# Patient Record
Sex: Female | Born: 1940 | Race: Black or African American | Hispanic: No | Marital: Single | Smoking: Never smoker
Health system: Southern US, Community
[De-identification: ages and names within clinical notes are randomized; demographics above are authoritative.]

## PROBLEM LIST (undated history)

## (undated) DIAGNOSIS — C541 Malignant neoplasm of endometrium: Secondary | ICD-10-CM

## (undated) DIAGNOSIS — K279 Peptic ulcer, site unspecified, unspecified as acute or chronic, without hemorrhage or perforation: Secondary | ICD-10-CM

## (undated) DIAGNOSIS — M1990 Unspecified osteoarthritis, unspecified site: Secondary | ICD-10-CM

## (undated) DIAGNOSIS — R5383 Other fatigue: Secondary | ICD-10-CM

## (undated) DIAGNOSIS — H269 Unspecified cataract: Secondary | ICD-10-CM

## (undated) DIAGNOSIS — K5792 Diverticulitis of intestine, part unspecified, without perforation or abscess without bleeding: Secondary | ICD-10-CM

## (undated) DIAGNOSIS — Z9289 Personal history of other medical treatment: Secondary | ICD-10-CM

## (undated) DIAGNOSIS — M810 Age-related osteoporosis without current pathological fracture: Secondary | ICD-10-CM

## (undated) DIAGNOSIS — K922 Gastrointestinal hemorrhage, unspecified: Secondary | ICD-10-CM

## (undated) DIAGNOSIS — I10 Essential (primary) hypertension: Secondary | ICD-10-CM

## (undated) HISTORY — DX: Unspecified osteoarthritis, unspecified site: M19.90

## (undated) HISTORY — DX: Age-related osteoporosis without current pathological fracture: M81.0

## (undated) HISTORY — DX: Other fatigue: R53.83

## (undated) HISTORY — DX: Essential (primary) hypertension: I10

## (undated) HISTORY — PX: TUBAL LIGATION: SHX77

---

## 1997-07-03 ENCOUNTER — Emergency Department (HOSPITAL_COMMUNITY): Admission: EM | Admit: 1997-07-03 | Discharge: 1997-07-03 | Payer: Self-pay | Admitting: Emergency Medicine

## 1998-04-17 ENCOUNTER — Emergency Department (HOSPITAL_COMMUNITY): Admission: EM | Admit: 1998-04-17 | Discharge: 1998-04-17 | Payer: Self-pay | Admitting: Emergency Medicine

## 1998-07-15 ENCOUNTER — Encounter: Payer: Self-pay | Admitting: Emergency Medicine

## 1998-07-15 ENCOUNTER — Emergency Department (HOSPITAL_COMMUNITY): Admission: EM | Admit: 1998-07-15 | Discharge: 1998-07-15 | Payer: Self-pay | Admitting: Emergency Medicine

## 1999-05-22 ENCOUNTER — Emergency Department (HOSPITAL_COMMUNITY): Admission: EM | Admit: 1999-05-22 | Discharge: 1999-05-22 | Payer: Self-pay | Admitting: Emergency Medicine

## 2006-12-06 ENCOUNTER — Emergency Department (HOSPITAL_COMMUNITY): Admission: EM | Admit: 2006-12-06 | Discharge: 2006-12-06 | Payer: Self-pay | Admitting: Emergency Medicine

## 2007-12-11 ENCOUNTER — Emergency Department (HOSPITAL_COMMUNITY): Admission: EM | Admit: 2007-12-11 | Discharge: 2007-12-11 | Payer: Self-pay | Admitting: Family Medicine

## 2008-02-11 ENCOUNTER — Emergency Department (HOSPITAL_COMMUNITY): Admission: EM | Admit: 2008-02-11 | Discharge: 2008-02-11 | Payer: Self-pay | Admitting: Emergency Medicine

## 2008-04-27 ENCOUNTER — Emergency Department (HOSPITAL_COMMUNITY): Admission: EM | Admit: 2008-04-27 | Discharge: 2008-04-27 | Payer: Self-pay | Admitting: Emergency Medicine

## 2009-02-23 ENCOUNTER — Encounter: Admission: RE | Admit: 2009-02-23 | Discharge: 2009-02-23 | Payer: Self-pay | Admitting: Family Medicine

## 2010-01-14 ENCOUNTER — Emergency Department (HOSPITAL_COMMUNITY): Admission: EM | Admit: 2010-01-14 | Discharge: 2009-03-14 | Payer: Self-pay | Admitting: Emergency Medicine

## 2010-02-06 ENCOUNTER — Emergency Department (HOSPITAL_COMMUNITY)
Admission: EM | Admit: 2010-02-06 | Discharge: 2010-02-06 | Payer: Self-pay | Source: Home / Self Care | Admitting: Family Medicine

## 2010-02-27 ENCOUNTER — Other Ambulatory Visit: Payer: Self-pay | Admitting: Family Medicine

## 2010-02-27 DIAGNOSIS — Z1239 Encounter for other screening for malignant neoplasm of breast: Secondary | ICD-10-CM

## 2010-02-27 DIAGNOSIS — Z780 Asymptomatic menopausal state: Secondary | ICD-10-CM

## 2010-02-28 ENCOUNTER — Encounter: Payer: Self-pay | Admitting: Family Medicine

## 2010-04-01 ENCOUNTER — Ambulatory Visit: Payer: Self-pay

## 2010-04-01 ENCOUNTER — Other Ambulatory Visit: Payer: Self-pay

## 2010-04-28 LAB — DIFFERENTIAL
Basophils Absolute: 0 10*3/uL (ref 0.0–0.1)
Basophils Relative: 0 % (ref 0–1)
Eosinophils Absolute: 0.2 10*3/uL (ref 0.0–0.7)
Neutro Abs: 10.1 10*3/uL — ABNORMAL HIGH (ref 1.7–7.7)
Neutrophils Relative %: 82 % — ABNORMAL HIGH (ref 43–77)

## 2010-04-28 LAB — URINALYSIS, ROUTINE W REFLEX MICROSCOPIC
Bilirubin Urine: NEGATIVE
Glucose, UA: NEGATIVE mg/dL
Hgb urine dipstick: NEGATIVE
Ketones, ur: NEGATIVE mg/dL
Nitrite: NEGATIVE
Protein, ur: NEGATIVE mg/dL
Specific Gravity, Urine: 1.013 (ref 1.005–1.030)
Urobilinogen, UA: 0.2 mg/dL (ref 0.0–1.0)
pH: 7.5 (ref 5.0–8.0)

## 2010-04-28 LAB — CBC
MCHC: 32.5 g/dL (ref 30.0–36.0)
MCV: 87.1 fL (ref 78.0–100.0)
Platelets: 198 10*3/uL (ref 150–400)
RDW: 14 % (ref 11.5–15.5)

## 2010-04-28 LAB — POCT I-STAT, CHEM 8
Chloride: 106 mEq/L (ref 96–112)
Glucose, Bld: 101 mg/dL — ABNORMAL HIGH (ref 70–99)
HCT: 52 % — ABNORMAL HIGH (ref 36.0–46.0)
Hemoglobin: 17.7 g/dL — ABNORMAL HIGH (ref 12.0–15.0)
Potassium: 4.9 mEq/L (ref 3.5–5.1)
Sodium: 137 mEq/L (ref 135–145)

## 2010-04-28 LAB — URINE MICROSCOPIC-ADD ON

## 2011-04-25 ENCOUNTER — Other Ambulatory Visit: Payer: Self-pay | Admitting: Family Medicine

## 2011-04-25 DIAGNOSIS — Z1231 Encounter for screening mammogram for malignant neoplasm of breast: Secondary | ICD-10-CM

## 2011-04-25 DIAGNOSIS — Z780 Asymptomatic menopausal state: Secondary | ICD-10-CM

## 2011-05-09 ENCOUNTER — Ambulatory Visit
Admission: RE | Admit: 2011-05-09 | Discharge: 2011-05-09 | Disposition: A | Discharge status: Home / Self Care | Payer: Medicare Other | Source: Ambulatory Visit | Attending: Family Medicine | Admitting: Family Medicine

## 2011-05-09 ENCOUNTER — Ambulatory Visit: Payer: Self-pay

## 2011-05-09 DIAGNOSIS — Z780 Asymptomatic menopausal state: Secondary | ICD-10-CM

## 2011-06-07 ENCOUNTER — Encounter: Payer: Self-pay | Admitting: Internal Medicine

## 2011-10-15 ENCOUNTER — Emergency Department (HOSPITAL_COMMUNITY)
Admission: EM | Admit: 2011-10-15 | Discharge: 2011-10-15 | Discharge status: Against Medical Advice | Payer: Medicare Other | Attending: Emergency Medicine | Admitting: Emergency Medicine

## 2011-10-15 ENCOUNTER — Encounter (HOSPITAL_COMMUNITY): Payer: Self-pay | Admitting: Physical Medicine and Rehabilitation

## 2011-10-15 DIAGNOSIS — R112 Nausea with vomiting, unspecified: Secondary | ICD-10-CM | POA: Insufficient documentation

## 2011-10-15 DIAGNOSIS — R1032 Left lower quadrant pain: Secondary | ICD-10-CM | POA: Insufficient documentation

## 2011-10-15 HISTORY — DX: Diverticulitis of intestine, part unspecified, without perforation or abscess without bleeding: K57.92

## 2011-10-15 LAB — COMPREHENSIVE METABOLIC PANEL
ALT: 13 U/L (ref 0–35)
AST: 23 U/L (ref 0–37)
Albumin: 3.9 g/dL (ref 3.5–5.2)
Chloride: 101 mEq/L (ref 96–112)
Creatinine, Ser: 0.57 mg/dL (ref 0.50–1.10)
Potassium: 4.1 mEq/L (ref 3.5–5.1)
Sodium: 136 mEq/L (ref 135–145)
Total Bilirubin: 0.2 mg/dL — ABNORMAL LOW (ref 0.3–1.2)

## 2011-10-15 LAB — CBC WITH DIFFERENTIAL/PLATELET
Eosinophils Absolute: 0.1 10*3/uL (ref 0.0–0.7)
Eosinophils Relative: 1 % (ref 0–5)
Lymphs Abs: 1 10*3/uL (ref 0.7–4.0)
MCH: 27.8 pg (ref 26.0–34.0)
MCHC: 32.5 g/dL (ref 30.0–36.0)
MCV: 85.5 fL (ref 78.0–100.0)
Monocytes Relative: 3 % (ref 3–12)
Platelets: 243 10*3/uL (ref 150–400)
RBC: 5.44 MIL/uL — ABNORMAL HIGH (ref 3.87–5.11)

## 2011-10-15 NOTE — ED Notes (Signed)
Pt presents to department for evaluation of LLQ abdominal pain and N/V/D. Onset today. Pt states history of diverticulitis. 5/10 "cramping" pain at the time. Denies urinary symptoms. She is alert and oriented x4.

## 2012-12-01 ENCOUNTER — Inpatient Hospital Stay (HOSPITAL_COMMUNITY)
Admission: EM | Admit: 2012-12-01 | Discharge: 2012-12-02 | DRG: 446 | Disposition: A | Discharge status: Home / Self Care | Payer: Medicare Other | Attending: Internal Medicine | Admitting: Internal Medicine

## 2012-12-01 ENCOUNTER — Inpatient Hospital Stay (HOSPITAL_COMMUNITY): Payer: Medicare Other

## 2012-12-01 ENCOUNTER — Other Ambulatory Visit (HOSPITAL_COMMUNITY): Payer: Medicare Other

## 2012-12-01 ENCOUNTER — Encounter (HOSPITAL_COMMUNITY): Payer: Self-pay | Admitting: Emergency Medicine

## 2012-12-01 ENCOUNTER — Emergency Department (HOSPITAL_COMMUNITY): Payer: Medicare Other

## 2012-12-01 DIAGNOSIS — R109 Unspecified abdominal pain: Secondary | ICD-10-CM

## 2012-12-01 DIAGNOSIS — Z9851 Tubal ligation status: Secondary | ICD-10-CM

## 2012-12-01 DIAGNOSIS — R112 Nausea with vomiting, unspecified: Secondary | ICD-10-CM

## 2012-12-01 DIAGNOSIS — I4510 Unspecified right bundle-branch block: Secondary | ICD-10-CM | POA: Diagnosis present

## 2012-12-01 DIAGNOSIS — K8000 Calculus of gallbladder with acute cholecystitis without obstruction: Principal | ICD-10-CM | POA: Diagnosis present

## 2012-12-01 DIAGNOSIS — I10 Essential (primary) hypertension: Secondary | ICD-10-CM | POA: Diagnosis present

## 2012-12-01 DIAGNOSIS — D72829 Elevated white blood cell count, unspecified: Secondary | ICD-10-CM

## 2012-12-01 DIAGNOSIS — Z7982 Long term (current) use of aspirin: Secondary | ICD-10-CM

## 2012-12-01 DIAGNOSIS — E559 Vitamin D deficiency, unspecified: Secondary | ICD-10-CM | POA: Diagnosis present

## 2012-12-01 DIAGNOSIS — K810 Acute cholecystitis: Secondary | ICD-10-CM

## 2012-12-01 DIAGNOSIS — Z8249 Family history of ischemic heart disease and other diseases of the circulatory system: Secondary | ICD-10-CM

## 2012-12-01 DIAGNOSIS — Z79899 Other long term (current) drug therapy: Secondary | ICD-10-CM

## 2012-12-01 HISTORY — DX: Nausea with vomiting, unspecified: R11.2

## 2012-12-01 LAB — CBC
MCH: 28.2 pg (ref 26.0–34.0)
MCV: 85.3 fL (ref 78.0–100.0)
Platelets: 266 10*3/uL (ref 150–400)
RDW: 13.6 % (ref 11.5–15.5)
WBC: 10.2 10*3/uL (ref 4.0–10.5)

## 2012-12-01 LAB — COMPREHENSIVE METABOLIC PANEL
AST: 24 U/L (ref 0–37)
Albumin: 3.9 g/dL (ref 3.5–5.2)
Calcium: 9.9 mg/dL (ref 8.4–10.5)
Creatinine, Ser: 0.59 mg/dL (ref 0.50–1.10)
Total Protein: 8.2 g/dL (ref 6.0–8.3)

## 2012-12-01 LAB — URINALYSIS, ROUTINE W REFLEX MICROSCOPIC
Glucose, UA: NEGATIVE mg/dL
Leukocytes, UA: NEGATIVE
Protein, ur: NEGATIVE mg/dL
pH: 8 (ref 5.0–8.0)

## 2012-12-01 MED ORDER — ASPIRIN 81 MG PO TABS: 81.0000 mg | ORAL_TABLET | Freq: Every day | ORAL | Status: DC

## 2012-12-01 MED ORDER — LOSARTAN POTASSIUM 50 MG PO TABS: 100.0000 mg | ORAL_TABLET | Freq: Every day | ORAL | Status: DC

## 2012-12-01 MED ORDER — IOHEXOL 300 MG/ML  SOLN
25.0000 mL | INTRAMUSCULAR | Status: AC
  Administered 2012-12-01 (×2): 25 mL via ORAL

## 2012-12-01 MED ORDER — LOSARTAN POTASSIUM 50 MG PO TABS
100.0000 mg | ORAL_TABLET | Freq: Every day | ORAL | Status: DC
  Administered 2012-12-01 – 2012-12-02 (×2): 100 mg via ORAL
  Filled 2012-12-01 (×2): qty 2

## 2012-12-01 MED ORDER — ONDANSETRON HCL 4 MG/2ML IJ SOLN
4.0000 mg | Freq: Four times a day (QID) | INTRAMUSCULAR | Status: DC | PRN
  Administered 2012-12-01: 4 mg via INTRAVENOUS
  Filled 2012-12-01: qty 2

## 2012-12-01 MED ORDER — ONDANSETRON HCL 4 MG/2ML IJ SOLN: 4.0000 mg | Freq: Three times a day (TID) | INTRAMUSCULAR | Status: DC | PRN

## 2012-12-01 MED ORDER — ASPIRIN 81 MG PO CHEW
81.0000 mg | CHEWABLE_TABLET | Freq: Every day | ORAL | Status: DC
  Administered 2012-12-01 – 2012-12-02 (×2): 81 mg via ORAL
  Filled 2012-12-01 (×2): qty 1

## 2012-12-01 MED ORDER — HYDROCODONE-ACETAMINOPHEN 5-325 MG PO TABS: 1.0000 | ORAL_TABLET | ORAL | Status: DC | PRN

## 2012-12-01 MED ORDER — ONDANSETRON HCL 4 MG PO TABS: 4.0000 mg | ORAL_TABLET | Freq: Four times a day (QID) | ORAL | Status: DC | PRN

## 2012-12-01 MED ORDER — ENOXAPARIN SODIUM 40 MG/0.4ML ~~LOC~~ SOLN
40.0000 mg | SUBCUTANEOUS | Status: DC
  Administered 2012-12-01 – 2012-12-02 (×2): 40 mg via SUBCUTANEOUS
  Filled 2012-12-01 (×2): qty 0.4

## 2012-12-01 MED ORDER — SODIUM CHLORIDE 0.9 % IV SOLN
INTRAVENOUS | Status: DC
  Administered 2012-12-01 (×2): via INTRAVENOUS

## 2012-12-01 MED ORDER — SODIUM CHLORIDE 0.9 % IV SOLN: INTRAVENOUS | Status: DC

## 2012-12-01 MED ORDER — IOHEXOL 300 MG/ML  SOLN
100.0000 mL | Freq: Once | INTRAMUSCULAR | Status: AC | PRN
  Administered 2012-12-01: 100 mL via INTRAVENOUS

## 2012-12-01 MED ORDER — ONDANSETRON HCL 4 MG/2ML IJ SOLN
4.0000 mg | Freq: Once | INTRAMUSCULAR | Status: AC
  Administered 2012-12-01: 4 mg via INTRAVENOUS
  Filled 2012-12-01: qty 2

## 2012-12-01 MED ORDER — HYDROMORPHONE HCL PF 1 MG/ML IJ SOLN: 1.0000 mg | INTRAMUSCULAR | Status: DC | PRN

## 2012-12-01 MED ORDER — AMLODIPINE BESYLATE 5 MG PO TABS
5.0000 mg | ORAL_TABLET | Freq: Every day | ORAL | Status: DC
  Administered 2012-12-01 – 2012-12-02 (×2): 5 mg via ORAL
  Filled 2012-12-01 (×2): qty 1

## 2012-12-01 NOTE — ED Notes (Signed)
Pt vomiting returned.  Now NPO.  Physician aware, order rec'd i.e. zofran IV.

## 2012-12-01 NOTE — ED Notes (Signed)
Patient transported to X-ray 

## 2012-12-01 NOTE — ED Notes (Signed)
Pt given crackers and ginger ale per v.o. Dr Dierdre Highman.

## 2012-12-01 NOTE — ED Provider Notes (Signed)
CSN: [id number]     Arrival date & time 12/01/12  0405 History   First MD Initiated Contact with Patient 12/01/12 0435     Chief Complaint  Patient presents with  . Emesis   (Consider location/radiation/quality/duration/timing/severity/associated sxs/prior Treatment) HPI History provided by patient. Around 11 PM developed nausea and vomiting followed by loose stools. No blood in emesis or stool. With onset of symptoms did have some left lower quadrant abdominal pain. Patient called EMS and received IV fluids and Zofran and is no longer having any abdominal pain. Patient had similar symptoms with a bout of diverticulitis in the past. She has remote history of tubal ligation but no other abdominal surgeries. No fevers or chills. No back pain. No difficulty urinating. Symptoms moderate in severity. Past Medical History  Diagnosis Date  . HTN (hypertension)   . Fatigue   . Vitamin D deficiency   . Arthritis   . Diverticulitis    Past Surgical History  Procedure Laterality Date  . Tubal ligation      at age 67   Family History  Problem Relation Age of Onset  . Heart attack Father 38    Died of natural causes  . Other Mother 76    Died of old age   History  Substance Use Topics  . Smoking status: Never Smoker   . Smokeless tobacco: Not on file  . Alcohol Use: No   OB History   Grav Para Term Preterm Abortions TAB SAB Ect Mult Living                 Review of Systems  Constitutional: Negative for fever and chills.  Eyes: Negative for visual disturbance.  Respiratory: Negative for shortness of breath.   Cardiovascular: Negative for chest pain.  Gastrointestinal: Positive for nausea, vomiting and diarrhea. Negative for blood in stool.  Genitourinary: Negative for dysuria.  Musculoskeletal: Negative for back pain, neck pain and neck stiffness.  Skin: Negative for rash.  Neurological: Negative for headaches.  All other systems reviewed and are negative.    Allergies   Review of patient's allergies indicates no known allergies.  Home Medications   Current Outpatient Rx  Name  Route  Sig  Dispense  Refill  . amLODipine (NORVASC) 5 MG tablet   Oral   Take 5 mg by mouth daily.         Marland Kitchen aspirin 81 MG tablet   Oral   Take 81 mg by mouth daily.         Alphonsus Sias Pollen 580 MG CAPS   Oral   Take 1 capsule by mouth daily.          . Cholecalciferol (VITAMIN D3) 5000 UNITS TABS   Oral   Take 1 tablet by mouth daily.         Marland Kitchen losartan (COZAAR) 100 MG tablet   Oral   Take 100 mg by mouth daily.          BP 158/97  Pulse 65  Temp(Src) 97.4 F (36.3 C) (Oral)  Resp 18  Ht 5\' 4"  (1.626 m)  Wt 174 lb (78.926 kg)  BMI 29.85 kg/m2  SpO2 100% Physical Exam  Constitutional: She is oriented to person, place, and time. She appears well-developed and well-nourished.  HENT:  Head: Normocephalic and atraumatic.  Mouth/Throat: Oropharynx is clear and moist.  Eyes: EOM are normal. Pupils are equal, round, and reactive to light. No scleral icterus.  Neck: Neck supple.  Cardiovascular: Normal  rate, regular rhythm and intact distal pulses.   Pulmonary/Chest: Effort normal and breath sounds normal. No respiratory distress.  Abdominal: Soft. Bowel sounds are normal. She exhibits no distension. There is no tenderness. There is no rebound and no guarding.  Musculoskeletal: Normal range of motion. She exhibits no edema.  Neurological: She is alert and oriented to person, place, and time.  Skin: Skin is warm and dry.    ED Course  Procedures (including critical care time) Labs Review Labs Reviewed  CBC - Abnormal; Notable for the following:    RBC 5.31 (*)    All other components within normal limits  COMPREHENSIVE METABOLIC PANEL - Abnormal; Notable for the following:    Glucose, Bld 161 (*)    Total Bilirubin 0.2 (*)    All other components within normal limits  LIPASE, BLOOD  URINALYSIS, ROUTINE W REFLEX MICROSCOPIC   Imaging Review No  results found.   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 IV Zofran. Serial abdominal exams nontender and pain-free in the emergency dept. Patient getting up to walk to the bathroom having loose stools.  She declines need for any pain medications.   5:53 AM third round of Zofran provided and patient still with nausea vomiting. Repeat exam remains benign with abdomen soft nontender nondistended. Acute abdominal series ordered and medicine consult for admission. D/w Dr Lovell Sheehan, plan admit MDM  Diagnosis: Persistent vomiting, nausea vomiting diarrhea  Evaluated with EKG, labs Treated with IV fluids and IV Zofran - remains symptomatic MED admit  Sunnie Nielsen, MD 12/01/12 0630

## 2012-12-01 NOTE — ED Notes (Signed)
Pt reports she at baked beans at approx 11pm tonight and began vomiting approx 2am.  Also soft stool but not runny.

## 2012-12-01 NOTE — ED Notes (Signed)
Patient denies pain although complains of soreness in abdomen. NAD noted report called to [address] [place] to [facility] [employer] [facility] [employer]. Patient to be transferred via stretcher NAD noted.

## 2012-12-01 NOTE — H&P (Signed)
[facility] History and Physical  April Lane ZOX:[id number] DOB: Dec 12, 1940 DOA: 12/01/2012  Referring physician: ED physician PCP: Willow Ora, MD   Chief Complaint: nausea and vomiting   HPI:  Pt is 72 yo pleasant female who presented to [facility] ED with main concern of several days duration of persistent generalized abdominal pain, throbbing and intermittent, 5/10 in severity, non radiating and associated with cramps, nausea and vomiting, loose  Stools and poor oral intake. She denies sick contacts or exposures, no recent sicknesses or hospitalizations, no similar events in the past. She denies fevers, chills, chest pain or shortness of breath. No specific urinary concerns.  In ED, pt with persistent discomfort. CBC adn BMP within normal limits. [facility] asked to admit for possible viral gastroenteritis.  Assessment and Plan:  Active Problems:   Nausea with vomiting - with loose stools - unclear etiology - will admit to medical floor and will provide supportive care with IVF, analgesia and antiemetics as needed - follow upon Ct abd/pelvis - keep NPO for now - continue to monitor electrolytes and supplement as indicated - check stool culture, O&P, GI viral panel    Code Status: Full Family Communication: Pt at bedside Disposition Plan: Admit to medical floor    Review of Systems:  Constitutional: Negative for diaphoresis.  HENT: Negative for hearing loss, ear pain, nosebleeds, congestion, sore throat, neck pain, tinnitus and ear discharge.   Eyes: Negative for blurred vision, double vision, photophobia, pain, discharge and redness.  Respiratory: Negative for cough, hemoptysis, sputum production, shortness of breath, wheezing and stridor.   Cardiovascular: Negative for chest pain, palpitations, orthopnea, claudication and leg swelling.  Gastrointestinal: Negative for heartburn, constipation, blood in stool and melena.  Genitourinary: Negative for dysuria, urgency,  frequency, hematuria and flank pain.  Musculoskeletal: Negative for myalgias, back pain, joint pain and falls.  Skin: Negative for itching and rash.  Neurological: Negative for tingling, tremors, sensory change, speech change, focal weakness, loss of consciousness and headaches.  Endo/Heme/Allergies: Negative for environmental allergies and polydipsia. Does not bruise/bleed easily.  Psychiatric/Behavioral: Negative for suicidal ideas. The patient is not nervous/anxious.      Past Medical History  Diagnosis Date  . HTN (hypertension)   . Fatigue   . Vitamin D deficiency   . Arthritis   . Diverticulitis     Past Surgical History  Procedure Laterality Date  . Tubal ligation      at age 63    Social History:  reports that she has never smoked. She does not have any smokeless tobacco history on file. She reports that she does not drink alcohol or use illicit drugs.  No Known Allergies  Family History  Problem Relation Age of Onset  . Heart attack Father 79    Died of natural causes  . Other Mother 8    Died of old age    Prior to Admission medications   Medication Sig Start Date End Date Taking? Authorizing Provider  amLODipine (NORVASC) 5 MG tablet Take 5 mg by mouth daily.   Yes Historical Provider, MD  aspirin 81 MG tablet Take 81 mg by mouth daily.   Yes Historical Provider, MD  Bee Pollen 580 MG CAPS Take 1 capsule by mouth daily.    Yes Historical Provider, MD  Cholecalciferol (VITAMIN D3) 5000 UNITS TABS Take 1 tablet by mouth daily.   Yes Historical Provider, MD  losartan (COZAAR) 100 MG tablet Take 100 mg by mouth daily.   Yes Historical Provider,  MD    Physical Exam: Filed Vitals:   12/01/12 0730 12/01/12 0735 12/01/12 0811 12/01/12 0852  BP:  146/77 155/72   Pulse: 94 66 66   Temp:   97.4 F (36.3 C)   TempSrc:   Oral   Resp:  18 20   Height:   5\' 4"  (1.626 m) 5\' 3"  (1.6 m)  Weight:   74 kg (163 lb 2.3 oz) 78.926 kg (174 lb)  SpO2: 67% 100% 100%      Physical Exam  Constitutional: Appears well-developed and well-nourished. No distress.  HENT: Normocephalic. External right and left ear normal. Oropharynx is clear and moist.  Eyes: Conjunctivae and EOM are normal. PERRLA, no scleral icterus.  Neck: Normal ROM. Neck supple. No JVD. No tracheal deviation. No thyromegaly.  CVS: RRR, S1/S2 +, no murmurs, no gallops, no carotid bruit.  Pulmonary: Effort and breath sounds normal, no stridor, rhonchi, wheezes, rales.  Abdominal: Soft. BS +,  no distension, tenderness, rebound or guarding.  Musculoskeletal: Normal range of motion. No edema and no tenderness.  Lymphadenopathy: No lymphadenopathy noted, cervical, inguinal. Neuro: Alert. Normal reflexes, muscle tone coordination. No cranial nerve deficit. Skin: Skin is warm and dry. No rash noted. Not diaphoretic. No erythema. No pallor.  Psychiatric: Normal mood and affect. Behavior, judgment, thought content normal.   Labs on Admission:  Basic Metabolic Panel:  Recent Labs Lab 12/01/12 0438  NA 135  K 3.9  CL 100  CO2 25  GLUCOSE 161*  BUN 18  CREATININE 0.59  CALCIUM 9.9   Liver Function Tests:  Recent Labs Lab 12/01/12 0438  AST 24  ALT 13  ALKPHOS 107  BILITOT 0.2*  PROT 8.2  ALBUMIN 3.9    Recent Labs Lab 12/01/12 0438  LIPASE 18   CBC:  Recent Labs Lab 12/01/12 0438  WBC 10.2  HGB 15.0  HCT 45.3  MCV 85.3  PLT 266   Radiological Exams on Admission: Dg Abd Acute W/chest 12/01/2012  1. Unremarkable bowel gas pattern; no free intra-abdominal air seen. 2. No acute cardiopulmonary process identified.     EKG: Normal sinus rhythm, no ST/T wave changes  Debbora Presto, MD  [facility] Pager [phone]  If 7PM-7AM, please contact night-coverage [email or web address] Password [facility] 12/01/2012, 9:17 AM

## 2012-12-02 DIAGNOSIS — D72829 Elevated white blood cell count, unspecified: Secondary | ICD-10-CM

## 2012-12-02 DIAGNOSIS — K8020 Calculus of gallbladder without cholecystitis without obstruction: Secondary | ICD-10-CM

## 2012-12-02 DIAGNOSIS — K810 Acute cholecystitis: Secondary | ICD-10-CM

## 2012-12-02 DIAGNOSIS — R109 Unspecified abdominal pain: Secondary | ICD-10-CM

## 2012-12-02 DIAGNOSIS — R112 Nausea with vomiting, unspecified: Secondary | ICD-10-CM

## 2012-12-02 LAB — CBC
HCT: 43.9 % (ref 36.0–46.0)
MCH: 28.1 pg (ref 26.0–34.0)
MCV: 84.4 fL (ref 78.0–100.0)
Platelets: 270 10*3/uL (ref 150–400)
RBC: 5.2 MIL/uL — ABNORMAL HIGH (ref 3.87–5.11)
RDW: 13.6 % (ref 11.5–15.5)
WBC: 14.6 10*3/uL — ABNORMAL HIGH (ref 4.0–10.5)

## 2012-12-02 LAB — BASIC METABOLIC PANEL
BUN: 12 mg/dL (ref 6–23)
CO2: 23 mEq/L (ref 19–32)
Chloride: 100 mEq/L (ref 96–112)
GFR calc Af Amer: >90 mL/min (ref >90)
GFR calc non Af Amer: >90 mL/min (ref >90)
Potassium: 3.5 mEq/L (ref 3.5–5.1)
Sodium: 135 mEq/L (ref 135–145)

## 2012-12-02 MED ORDER — CIPROFLOXACIN IN D5W 400 MG/200ML IV SOLN
400.0000 mg | Freq: Two times a day (BID) | INTRAVENOUS | Status: DC
  Administered 2012-12-02: 400 mg via INTRAVENOUS
  Filled 2012-12-02 (×2): qty 200

## 2012-12-02 MED ORDER — CIPROFLOXACIN HCL 500 MG PO TABS: 500.0000 mg | ORAL_TABLET | Freq: Two times a day (BID) | ORAL | Status: DC

## 2012-12-02 MED ORDER — ONDANSETRON 4 MG PO TBDP: 4.0000 mg | ORAL_TABLET | Freq: Three times a day (TID) | ORAL | Status: DC | PRN

## 2012-12-02 NOTE — Discharge Summary (Signed)
Physician Discharge Summary  April Lane WGN:[id number] DOB: 07/05/1940 DOA: 12/01/2012  PCP: Willow Ora, MD  Admit date: 12/01/2012 Discharge date: 12/02/2012  Time spent: 35 minutes  Recommendations for Outpatient Follow-up:  1. Patient did not wish to stay for cholecystectomy. I instructed her to followup with Dr. Carolynne Edouard on general surgery early this week  Discharge Diagnoses:  Active Problems:   Nausea with vomiting   Cholecystitis, acute   Leukocytosis, unspecified   Abdominal  pain, other specified site   Discharge Condition: Stable  Diet recommendation: Heart healthy diet  Filed Weights   12/01/12 0415 12/01/12 0811 12/01/12 0852  Weight: 78.926 kg (174 lb) 74 kg (163 lb 2.3 oz) 78.926 kg (174 lb)    History of present illness:  Pt is 72 yo pleasant female who presented to [facility] ED with main concern of several days duration of persistent generalized abdominal pain, throbbing and intermittent, 5/10 in severity, non radiating and associated with cramps, nausea and vomiting, loose Stools and poor oral intake. She denies sick contacts or exposures, no recent sicknesses or hospitalizations, no similar events in the past. She denies fevers, chills, chest pain or shortness of breath. No specific urinary concerns.  Hospital Course:  Patient was admitted to the medicine service, given IV fluid resuscitation, supportive care. By the following morning she reported having significant improvement to her symptoms expressing desire to be discharged today. However CT scan of abdomen and pelvis showed pericholecystic fluid and diffuse gallbladder wall thickening with presence of large stones within the gallbladder, findings suggestive of acute cholecystitis. I consulted Dr. Carolynne Edouard of Gen. Surgery. He recommended starting ciprofloxacin. He also recommended patient to stay in the hospital to continue antibiotic therapy and undergo cholecystectomy prior to discharge. Patient felt very strongly  about leaving the hospital today and follow up with surgery as an outpatient. I reassessed in the evening, and again verbalized her desire to go home today. I again communicated surgery's recommendations for her to stay, explaining that she could potentially get worse at home. I also explained her abnormal lab work including elevated white count. Patient verbalized her understanding of the potential consequences however expresses her desire to be discharged today and followup with surgery as an outpatient. I noted that she continued to be asymptomatic, was tolerating by mouth intake, remained afebrile and hemodynamically stable. Per patient's wishes, she was discharged on 12/02/2012   Consultations:  General surgery  Discharge Exam: Filed Vitals:   12/02/12 1350  BP: 127/78  Pulse: 81  Temp: 98.3 F (36.8 C)  Resp: 17    General: Patient is in no acute distress she is awake alert and oriented Cardiovascular: Regular rate and rhythm normal S1-S2 Respiratory: Lungs clear to auscultation bilaterally no wheezing rhonchi or rales Abdomen: Soft nontender nondistended positive bowel sounds Extremities: No edema  Discharge Instructions      Discharge Orders   Future Orders Complete By Expires   Call MD for:  extreme fatigue  As directed    Call MD for:  persistant nausea and vomiting  As directed    Call MD for:  severe uncontrolled pain  As directed    Call MD for:  temperature >100.4  As directed    Diet - low sodium heart healthy  As directed    Discharge instructions  As directed    Comments:     Please follow up with Dr Carolynne Edouard of General Surgery [address] [place], [place], [place] [phone]  Call tomorrow morning to schedule  an appointment this week   Increase activity slowly  As directed        Medication List         amLODipine 5 MG tablet  Commonly known as:  NORVASC  Take 5 mg by mouth daily.     aspirin 81 MG tablet  Take 81 mg by mouth daily.     Bee Pollen 580  MG Caps  Take 1 capsule by mouth daily.     ciprofloxacin 500 MG tablet  Commonly known as:  CIPRO  Take 1 tablet (500 mg total) by mouth 2 (two) times daily.     losartan 100 MG tablet  Commonly known as:  COZAAR  Take 100 mg by mouth daily.     ondansetron 4 MG disintegrating tablet  Commonly known as:  ZOFRAN ODT  Take 1 tablet (4 mg total) by mouth every 8 (eight) hours as needed for nausea.     Vitamin D3 5000 UNITS Tabs  Take 1 tablet by mouth daily.       No Known Allergies Follow-up Information   Follow up with TOTH III,PAUL S, MD In 3 days.   Specialty:  General Surgery   Contact information:   [address] [place] [address] [phone]       Follow up with ANDY,CAMILLE L, MD In 1 week.   Specialty:  Family Medicine   Contact information:   [address] [place] [address] [phone]        The results of significant diagnostics from this hospitalization (including imaging, microbiology, ancillary and laboratory) are listed below for reference.    Significant Diagnostic Studies: Ct Abdomen Pelvis W Contrast  12/01/2012   CLINICAL DATA:  Lower abdominal pain, nausea, vomiting and diarrhea.  EXAM: CT ABDOMEN AND PELVIS WITH CONTRAST  TECHNIQUE: Multidetector CT imaging of the abdomen and pelvis was performed using the standard protocol following bolus administration of intravenous contrast.  CONTRAST:  OMNIPAQUE IOHEXOL 300 MG/ML  SOLN  COMPARISON:  CT of the abdomen and pelvis performed 03/14/2009  FINDINGS: The visualized lung bases are clear.  Pericholecystic fluid and diffuse gallbladder wall thickening are noted, with minimal associated soft tissue stranding. Large stones are again seen within the gallbladder. Findings are most compatible with acute cholecystitis.  A few small calcified granulomata are seen within the liver. The liver and spleen are otherwise unremarkable in appearance. The pancreas and adrenal glands  are unremarkable.  Scattered hypodensities within the kidneys likely reflect small cysts; the kidneys are otherwise unremarkable in appearance. There is no evidence of hydronephrosis. No renal or ureteral stones are seen. No perinephric stranding is appreciated.  No free fluid is identified. The small bowel is unremarkable in appearance. The stomach is within normal limits. No acute vascular abnormalities are seen.  The appendix is normal in caliber and contains air, without evidence for appendicitis. Contrast progresses to the distal ascending colon. Scattered diverticulosis is noted along the distal descending and sigmoid colon, without evidence of diverticulitis. The colon is otherwise unremarkable in appearance.  The bladder is mildly distended and grossly unremarkable in appearance. Multiple fibroids are seen within the uterus, several of which are calcified in nature. The ovaries are relatively symmetric, with a small left adnexal cyst, likely physiologic in nature. No suspicious adnexal masses are seen. No inguinal lymphadenopathy is seen.  No acute osseous abnormalities are identified. Multilevel vacuum phenomenon is incidentally noted at the lower lumbar spine.  IMPRESSION: 1. Pericholecystic  fluid and diffuse gallbladder wall thickening, with minimal associated soft tissue inflammation. Large stones again seen in the gallbladder. Findings compatible with acute cholecystitis. 2. Likely small hepatic cysts seen. 3. Scattered diverticulosis along the distal descending and sigmoid colon, without evidence of diverticulitis. 4. Fibroid uterus noted.  These results were called by telephone at the time of interpretation on 12/01/2012 at 11:17 PM to Clydie Braun RN on [facility], who verbally acknowledged these results.   Electronically Signed   By: Roanna Raider M.D.   On: 12/01/2012 23:18   Dg Abd Acute W/chest  12/01/2012   CLINICAL DATA:  Nausea and vomiting; history of diverticulitis.  EXAM: ACUTE ABDOMEN SERIES  (ABDOMEN 2 VIEW & CHEST 1 VIEW)  COMPARISON:  CT of the abdomen and pelvis performed 03/14/2009, and chest radiograph performed 02/06/2010  FINDINGS: The lungs are well-aerated and clear. There is no evidence of focal opacification, pleural effusion or pneumothorax. The cardiomediastinal silhouette is within normal limits.  The visualized bowel gas pattern is unremarkable. Scattered stool and air are seen within the colon; there is no evidence of small bowel dilatation to suggest obstruction. No free intra-abdominal air is identified on the provided upright view.  No acute osseous abnormalities are seen; the sacroiliac joints are unremarkable in appearance.  IMPRESSION: 1. Unremarkable bowel gas pattern; no free intra-abdominal air seen. 2. No acute cardiopulmonary process identified.   Electronically Signed   By: Roanna Raider M.D.   On: 12/01/2012 06:55    Microbiology: No results found for this or any previous visit (from the past 240 hour(s)).   Labs: Basic Metabolic Panel:  Recent Labs Lab 12/01/12 0438 12/02/12 0530  NA 135 135  K 3.9 3.5  CL 100 100  CO2 25 23  GLUCOSE 161* 119*  BUN 18 12  CREATININE 0.59 0.57  CALCIUM 9.9 9.7   Liver Function Tests:  Recent Labs Lab 12/01/12 0438  AST 24  ALT 13  ALKPHOS 107  BILITOT 0.2*  PROT 8.2  ALBUMIN 3.9    Recent Labs Lab 12/01/12 0438  LIPASE 18   No results found for this basename: AMMONIA,  in the last 168 hours CBC:  Recent Labs Lab 12/01/12 0438 12/02/12 0530  WBC 10.2 14.6*  HGB 15.0 14.6  HCT 45.3 43.9  MCV 85.3 84.4  PLT 266 270   Cardiac Enzymes: No results found for this basename: CKTOTAL, CKMB, CKMBINDEX, TROPONINI,  in the last 168 hours BNP: BNP (last 3 results) No results found for this basename: PROBNP,  in the last 8760 hours CBG: No results found for this basename: GLUCAP,  in the last 168 hours     Signed:  Jeralyn Bennett  [facility] 12/02/2012, 6:05 PM

## 2012-12-02 NOTE — Consult Note (Signed)
Reason for Consult: cholelithiasis/cholecystitis Referring Physician: Crista Elliot  April Lane is an 72 y.o. female.  HPI: Pt is 72 yo pleasant female who presented to [facility] ED with main concern of nausea and vomiting that started Friday PM after eating some beans.  April Lane also complained of  loose Stools and poor oral intake. April Lane tells me April Lane did not have pain till April Lane started having ongoing episode of emesis.  That does not correspond to what April Lane said in the ER where April Lane reported abdominal pain. April Lane denies sick contacts or exposures, no recent sicknesses or hospitalizations, no similar events in the past. April Lane denies fevers, chills, chest pain or shortness of breath. No specific urinary concerns. Work up in the ER revealed a normal CBC, lft's were normal, and lipase was normal.  CT scan showed;  Pericholecystic fluid and diffuse gallbladder wall thickening, with minimal associated soft tissue inflammation. Large stones again seen in the gallbladder. Findings compatible with acute Cholecystitis. Repeat labs this AM show a normal BMP, WBC is up, no repeat lipase or LFT's.  To day April Lane feels fine on clear liquids and want to go home and address this another day.     Past Medical History  Diagnosis Date  . HTN (hypertension)   . Fatigue   . Vitamin D deficiency   . Arthritis   . Diverticulitis  03/2009     Past Surgical History  Procedure Laterality Date  . Tubal ligation      at age 52    Family History  Problem Relation Age of Onset  . Heart attack Father 78    Died of natural causes  . Other Mother 43    Died of old age    Social History:  reports that April Lane has never smoked. April Lane does not have any smokeless tobacco history on file. April Lane reports that April Lane does not drink alcohol or use illicit drugs.  Allergies: No Known Allergies  Medications:  Prior to Admission:  Prescriptions prior to admission  Medication Sig Dispense Refill  . amLODipine (NORVASC) 5 MG tablet Take 5 mg by  mouth daily.      Marland Kitchen aspirin 81 MG tablet Take 81 mg by mouth daily.      Alphonsus Sias Pollen 580 MG CAPS Take 1 capsule by mouth daily.       . Cholecalciferol (VITAMIN D3) 5000 UNITS TABS Take 1 tablet by mouth daily.      Marland Kitchen losartan (COZAAR) 100 MG tablet Take 100 mg by mouth daily.       Scheduled: . amLODipine  5 mg Oral Daily  . aspirin  81 mg Oral Daily  . ciprofloxacin  400 mg Intravenous Q12H  . enoxaparin (LOVENOX) injection  40 mg Subcutaneous Q24H  . losartan  100 mg Oral Daily   Continuous: . sodium chloride 125 mL/hr at 12/01/12 1051   ZOX:[id number], HYDROmorphone (DILAUDID) injection, ondansetron (ZOFRAN) IV, ondansetron Anti-infectives   Start     Dose/Rate Route Frequency Ordered Stop   12/02/12 1100  ciprofloxacin (CIPRO) IVPB 400 mg     400 mg 200 mL/hr over 60 Minutes Intravenous Every 12 hours 12/02/12 1011        Results for orders placed during the hospital encounter of 12/01/12 (from the past 48 hour(s))  CBC     Status: Abnormal   Collection Time    12/01/12  4:38 AM      Result Value Range   WBC 10.2  4.0 - 10.5 K/uL  RBC 5.31 (*) 3.87 - 5.11 MIL/uL   Hemoglobin 15.0  12.0 - 15.0 g/dL   HCT 11.9  14.7 - 82.9 %   MCV 85.3  78.0 - 100.0 fL   MCH 28.2  26.0 - 34.0 pg   MCHC 33.1  30.0 - 36.0 g/dL   RDW 56.2  13.0 - 86.5 %   Platelets 266  150 - 400 K/uL  COMPREHENSIVE METABOLIC PANEL     Status: Abnormal   Collection Time    12/01/12  4:38 AM      Result Value Range   Sodium 135  135 - 145 mEq/L   Potassium 3.9  3.5 - 5.1 mEq/L   Chloride 100  96 - 112 mEq/L   CO2 25  19 - 32 mEq/L   Glucose, Bld 161 (*) 70 - 99 mg/dL   BUN 18  6 - 23 mg/dL   Creatinine, Ser 7.84  0.50 - 1.10 mg/dL   Calcium 9.9  8.4 - 69.6 mg/dL   Total Protein 8.2  6.0 - 8.3 g/dL   Albumin 3.9  3.5 - 5.2 g/dL   AST 24  0 - 37 U/L   ALT 13  0 - 35 U/L   Alkaline Phosphatase 107  39 - 117 U/L   Total Bilirubin 0.2 (*) 0.3 - 1.2 mg/dL   GFR calc non Af Amer >90   >90 mL/min   GFR calc Af Amer >90  >90 mL/min   Comment: (NOTE)     The eGFR has been calculated using the CKD EPI equation.     This calculation has not been validated in all clinical situations.     eGFR's persistently <90 mL/min signify possible Chronic Kidney     Disease.  LIPASE, BLOOD     Status: None   Collection Time    12/01/12  4:38 AM      Result Value Range   Lipase 18  11 - 59 U/L  URINALYSIS, ROUTINE W REFLEX MICROSCOPIC     Status: None   Collection Time    12/01/12  4:53 AM      Result Value Range   Color, Urine YELLOW  YELLOW   APPearance CLEAR  CLEAR   Specific Gravity, Urine 1.014  1.005 - 1.030   pH 8.0  5.0 - 8.0   Glucose, UA NEGATIVE  NEGATIVE mg/dL   Hgb urine dipstick NEGATIVE  NEGATIVE   Bilirubin Urine NEGATIVE  NEGATIVE   Ketones, ur NEGATIVE  NEGATIVE mg/dL   Protein, ur NEGATIVE  NEGATIVE mg/dL   Urobilinogen, UA 0.2  0.0 - 1.0 mg/dL   Nitrite NEGATIVE  NEGATIVE   Leukocytes, UA NEGATIVE  NEGATIVE   Comment: MICROSCOPIC NOT DONE ON URINES WITH NEGATIVE PROTEIN, BLOOD, LEUKOCYTES, NITRITE, OR GLUCOSE <1000 mg/dL.  BASIC METABOLIC PANEL     Status: Abnormal   Collection Time    12/02/12  5:30 AM      Result Value Range   Sodium 135  135 - 145 mEq/L   Potassium 3.5  3.5 - 5.1 mEq/L   Chloride 100  96 - 112 mEq/L   CO2 23  19 - 32 mEq/L   Glucose, Bld 119 (*) 70 - 99 mg/dL   BUN 12  6 - 23 mg/dL   Creatinine, Ser 2.95  0.50 - 1.10 mg/dL   Calcium 9.7  8.4 - 28.4 mg/dL   GFR calc non Af Amer >90  >90 mL/min   GFR calc Af Amer >  90  >90 mL/min   Comment: (NOTE)     The eGFR has been calculated using the CKD EPI equation.     This calculation has not been validated in all clinical situations.     eGFR's persistently <90 mL/min signify possible Chronic Kidney     Disease.  CBC     Status: Abnormal   Collection Time    12/02/12  5:30 AM      Result Value Range   WBC 14.6 (*) 4.0 - 10.5 K/uL   RBC 5.20 (*) 3.87 - 5.11 MIL/uL   Hemoglobin 14.6   12.0 - 15.0 g/dL   HCT 65.7  84.6 - 96.2 %   MCV 84.4  78.0 - 100.0 fL   MCH 28.1  26.0 - 34.0 pg   MCHC 33.3  30.0 - 36.0 g/dL   RDW 95.2  84.1 - 32.4 %   Platelets 270  150 - 400 K/uL    Ct Abdomen Pelvis W Contrast  12/01/2012   CLINICAL DATA:  Lower abdominal pain, nausea, vomiting and diarrhea.  EXAM: CT ABDOMEN AND PELVIS WITH CONTRAST  TECHNIQUE: Multidetector CT imaging of the abdomen and pelvis was performed using the standard protocol following bolus administration of intravenous contrast.  CONTRAST:  OMNIPAQUE IOHEXOL 300 MG/ML  SOLN  COMPARISON:  CT of the abdomen and pelvis performed 03/14/2009  FINDINGS: The visualized lung bases are clear.  Pericholecystic fluid and diffuse gallbladder wall thickening are noted, with minimal associated soft tissue stranding. Large stones are again seen within the gallbladder. Findings are most compatible with acute cholecystitis.  A few small calcified granulomata are seen within the liver. The liver and spleen are otherwise unremarkable in appearance. The pancreas and adrenal glands are unremarkable.  Scattered hypodensities within the kidneys likely reflect small cysts; the kidneys are otherwise unremarkable in appearance. There is no evidence of hydronephrosis. No renal or ureteral stones are seen. No perinephric stranding is appreciated.  No free fluid is identified. The small bowel is unremarkable in appearance. The stomach is within normal limits. No acute vascular abnormalities are seen.  The appendix is normal in caliber and contains air, without evidence for appendicitis. Contrast progresses to the distal ascending colon. Scattered diverticulosis is noted along the distal descending and sigmoid colon, without evidence of diverticulitis. The colon is otherwise unremarkable in appearance.  The bladder is mildly distended and grossly unremarkable in appearance. Multiple fibroids are seen within the uterus, several of which are calcified in  nature. The ovaries are relatively symmetric, with a small left adnexal cyst, likely physiologic in nature. No suspicious adnexal masses are seen. No inguinal lymphadenopathy is seen.  No acute osseous abnormalities are identified. Multilevel vacuum phenomenon is incidentally noted at the lower lumbar spine.  IMPRESSION: 1. Pericholecystic fluid and diffuse gallbladder wall thickening, with minimal associated soft tissue inflammation. Large stones again seen in the gallbladder. Findings compatible with acute cholecystitis. 2. Likely small hepatic cysts seen. 3. Scattered diverticulosis along the distal descending and sigmoid colon, without evidence of diverticulitis. 4. Fibroid uterus noted.  These results were called by telephone at the time of interpretation on 12/01/2012 at 11:17 PM to Clydie Braun RN on [facility], who verbally acknowledged these results.   Electronically Signed   By: Roanna Raider M.D.   On: 12/01/2012 23:18   Dg Abd Acute W/chest  12/01/2012   CLINICAL DATA:  Nausea and vomiting; history of diverticulitis.  EXAM: ACUTE ABDOMEN SERIES (ABDOMEN 2 VIEW & CHEST 1 VIEW)  COMPARISON:  CT of the abdomen and pelvis performed 03/14/2009, and chest radiograph performed 02/06/2010  FINDINGS: The lungs are well-aerated and clear. There is no evidence of focal opacification, pleural effusion or pneumothorax. The cardiomediastinal silhouette is within normal limits.  The visualized bowel gas pattern is unremarkable. Scattered stool and air are seen within the colon; there is no evidence of small bowel dilatation to suggest obstruction. No free intra-abdominal air is identified on the provided upright view.  No acute osseous abnormalities are seen; the sacroiliac joints are unremarkable in appearance.  IMPRESSION: 1. Unremarkable bowel gas pattern; no free intra-abdominal air seen. 2. No acute cardiopulmonary process identified.   Electronically Signed   By: Roanna Raider M.D.   On: 12/01/2012 06:55    Review  of Systems  Constitutional: Negative for fever, chills, weight loss, malaise/fatigue and diaphoresis.  HENT: Negative.   Eyes: Negative.        Early cataracts developing  Respiratory: Positive for cough. Negative for hemoptysis, sputum production, shortness of breath and wheezing.   Cardiovascular: Negative.   Gastrointestinal: Positive for nausea, vomiting, abdominal pain (discomfort in LUQ after vomiting, no pain with onset of symptoms.) and diarrhea (diarrhea and vomiting both started Friday evening after eating some beans.). Negative for constipation, blood in stool and melena.       Occasional constipation   Genitourinary: Negative.   Musculoskeletal: Negative.   Skin: Negative.   Neurological: Negative.  Negative for weakness.  Endo/Heme/Allergies: Negative.   Psychiatric/Behavioral: Negative.    Blood pressure 127/61, pulse 79, temperature 97.9 F (36.6 C), temperature source Oral, resp. rate 18, height 5\' 3"  (1.6 m), weight 78.926 kg (174 lb), SpO2 99.00%. Physical Exam  Constitutional: April Lane is oriented to person, place, and time. April Lane appears well-developed and well-nourished. No distress.  Taking clears, no pain, no nausea, no vomiting now.  HENT:  Head: Normocephalic and atraumatic.  Nose: Nose normal.  Eyes: Conjunctivae and EOM are normal. Pupils are equal, round, and reactive to light. Right eye exhibits no discharge. Left eye exhibits no discharge. No scleral icterus.  Neck: Normal range of motion. Neck supple. No JVD present. No tracheal deviation present. No thyromegaly present.  Cardiovascular: Normal rate, regular rhythm, normal heart sounds and intact distal pulses.  Exam reveals no gallop.   No murmur heard. Respiratory: Effort normal and breath sounds normal. No respiratory distress. April Lane has no wheezes. April Lane has no rales. April Lane exhibits no tenderness.  GI: Soft. Bowel sounds are normal. April Lane exhibits no distension and no mass. There is tenderness (April Lane can still feel  some soreness LUQ, not having any pain now.). There is no rebound and no guarding.  Musculoskeletal: April Lane exhibits no edema and no tenderness.  Lymphadenopathy:    April Lane has no cervical adenopathy.  Neurological: April Lane is alert and oriented to person, place, and time. No cranial nerve deficit.  Skin: Skin is warm. No rash noted. April Lane is diaphoretic. No erythema. No pallor.  Psychiatric: April Lane has a normal mood and affect. Her behavior is normal. Judgment and thought content normal.    Assessment/Plan: 1.  Acute onset of nausea/vomiting/diarrhea with possible enteritis. 2. Cholelithiasis with cholecystitis on CT, currently asymptomatic. 3.  Leukocytosis 4.  Hypertension 5.  Hx of mild diverticulitis with cholelithiasis found on CT 03/14/09.  Plan:  Based on rising WBC and CT findings I would leave her on antibiotics today and get her Gallbladder out tomorrow.  April Lane would like to go home and come back another time, especially  now that April Lane feels better.  April Lane shows no sign of cholecystitis currently on exam.  Will discuss with Dr. Carolynne Edouard.    Keshun Berrett 12/02/2012, 1:35 PM

## 2012-12-02 NOTE — Consult Note (Signed)
Although the pt appears to have cholecystitis, she feels fine now without any symptoms. My recommendation would be for her to stay and be treated with abx and surgery to remove her gallbladder. She on the other hand feels very strongly that she wants to leave and follow up with [place] as an outpatient. If she chooses this route then she needs to be on abx at home and have close follow up with surgery. She understands that she could go home and get sick and have to come back. We will be happy to take care of her whatever she chooses

## 2012-12-19 ENCOUNTER — Encounter (INDEPENDENT_AMBULATORY_CARE_PROVIDER_SITE_OTHER): Payer: Self-pay | Admitting: General Surgery

## 2012-12-19 ENCOUNTER — Ambulatory Visit (INDEPENDENT_AMBULATORY_CARE_PROVIDER_SITE_OTHER): Payer: Medicare Other | Admitting: General Surgery

## 2012-12-19 VITALS — BP 128/80 | HR 78 | Temp 97.2°F | Resp 18 | Ht 64.0 in | Wt 169.0 lb

## 2012-12-19 DIAGNOSIS — K810 Acute cholecystitis: Secondary | ICD-10-CM

## 2012-12-19 NOTE — Progress Notes (Signed)
Patient ID: April Lane, female   DOB: 09/24/1940, 72 y.o.   MRN: [id number]  Chief Complaint  Patient presents with  . Cholelithiasis    HPI April Lane is a 72 y.o. female.  We are asked to see the patient in consultation by Dr. Andy to evaluate her for gallstones. The patient is a 72-year-old female who was recently hospitalized for a day with cholecystitis with cholelithiasis. The recommendation was for her to stay and have her gallbladder removed but she declined and left the hospital AGAINST MEDICAL ADVICE. She has done well at home. She denies any abdominal pain. Her appetite has been good. She has had some diarrhea. Her CT scan showed large stones in her gallbladder with surrounding inflammation and a thickened gallbladder wall. Her liver functions were normal. HPI  Past Medical History  Diagnosis Date  . HTN (hypertension)   . Fatigue   . Vitamin D deficiency   . Arthritis   . Diverticulitis   . Osteoporosis     Past Surgical History  Procedure Laterality Date  . Tubal ligation      at age 32    Family History  Problem Relation Age of Onset  . Heart attack Father 50    Died of natural causes  . Other Mother 97    Died of old age  . Prostate cancer Father     Social History History  Substance Use Topics  . Smoking status: Never Smoker   . Smokeless tobacco: Not on file  . Alcohol Use: No    No Known Allergies  Current Outpatient Prescriptions  Medication Sig Dispense Refill  . amLODipine (NORVASC) 5 MG tablet Take 5 mg by mouth daily.      . aspirin 81 MG tablet Take 81 mg by mouth daily.      . Bee Pollen 580 MG CAPS Take 1 capsule by mouth daily.       . Cholecalciferol (VITAMIN D3) 5000 UNITS TABS Take 1 tablet by mouth daily.      . losartan (COZAAR) 100 MG tablet Take 100 mg by mouth daily.      . naproxen sodium (ANAPROX) 220 MG tablet Take 220 mg by mouth as needed.       No current facility-administered medications for this visit.    Review  of Systems Review of Systems  Constitutional: Negative.   HENT: Negative.   Eyes: Negative.   Respiratory: Negative.   Cardiovascular: Negative.   Gastrointestinal: Positive for diarrhea.  Endocrine: Negative.   Genitourinary: Negative.   Musculoskeletal: Negative.   Skin: Negative.   Allergic/Immunologic: Negative.   Neurological: Negative.   Hematological: Negative.   Psychiatric/Behavioral: Negative.     Blood pressure 128/80, pulse 78, temperature 97.2 F (36.2 C), temperature source Temporal, resp. rate 18, height 5' 4" (1.626 m), weight 169 lb (76.658 kg).  Physical Exam Physical Exam  Constitutional: She is oriented to person, place, and time. She appears well-developed and well-nourished.  HENT:  Head: Normocephalic and atraumatic.  Eyes: Conjunctivae and EOM are normal. Pupils are equal, round, and reactive to light.  Neck: Normal range of motion. Neck supple.  Cardiovascular: Normal rate, regular rhythm and normal heart sounds.   Pulmonary/Chest: Effort normal and breath sounds normal.  Abdominal: Soft. Bowel sounds are normal. There is no tenderness.  Musculoskeletal: Normal range of motion.  Neurological: She is alert and oriented to person, place, and time.  Skin: Skin is warm and dry.  Psychiatric: She has   a normal mood and affect. Her behavior is normal.    Data Reviewed As above  Assessment    The patient had a significant episode of cholecystitis with cholelithiasis. Her symptoms have resolved on antibiotics. Because of the risk of further painful episodes and possible pancreatitis I think she would benefit from having her gallbladder removed. She would also like to have this done. I've discussed with her in detail the risks and benefits of the operation to remove the gallbladder as well as some of the technical aspects and she understands and wishes to proceed     Plan    Plan for laparoscopic cholecystectomy with intraoperative cholangiogram         TOTH III,Yvone Slape S 12/19/2012, 3:35 PM    

## 2012-12-19 NOTE — Patient Instructions (Signed)
Plan for lap chole with ioc 

## 2012-12-31 ENCOUNTER — Encounter (HOSPITAL_COMMUNITY): Payer: Self-pay | Admitting: Pharmacy Technician

## 2013-01-09 ENCOUNTER — Encounter (HOSPITAL_COMMUNITY): Payer: Self-pay

## 2013-01-09 ENCOUNTER — Encounter (HOSPITAL_COMMUNITY)
Admission: RE | Admit: 2013-01-09 | Discharge: 2013-01-09 | Disposition: A | Discharge status: Home / Self Care | Payer: Medicare Other | Source: Ambulatory Visit | Attending: General Surgery | Admitting: General Surgery

## 2013-01-09 ENCOUNTER — Ambulatory Visit (HOSPITAL_COMMUNITY)
Admission: RE | Admit: 2013-01-09 | Discharge: 2013-01-09 | Disposition: A | Discharge status: Home / Self Care | Payer: Medicare Other | Source: Ambulatory Visit | Attending: Anesthesiology | Admitting: Anesthesiology

## 2013-01-09 DIAGNOSIS — I10 Essential (primary) hypertension: Secondary | ICD-10-CM | POA: Insufficient documentation

## 2013-01-09 DIAGNOSIS — Z01818 Encounter for other preprocedural examination: Secondary | ICD-10-CM | POA: Insufficient documentation

## 2013-01-09 HISTORY — DX: Personal history of other medical treatment: Z92.89

## 2013-01-09 HISTORY — DX: Peptic ulcer, site unspecified, unspecified as acute or chronic, without hemorrhage or perforation: K27.9

## 2013-01-09 HISTORY — DX: Gastrointestinal hemorrhage, unspecified: K92.2

## 2013-01-09 HISTORY — DX: Unspecified cataract: H26.9

## 2013-01-09 LAB — BASIC METABOLIC PANEL
Calcium: 10.6 mg/dL — ABNORMAL HIGH (ref 8.4–10.5)
Chloride: 103 mEq/L (ref 96–112)
GFR calc Af Amer: >90 mL/min (ref >90)
Glucose, Bld: 103 mg/dL — ABNORMAL HIGH (ref 70–99)
Potassium: 3.9 mEq/L (ref 3.5–5.1)
Sodium: 138 mEq/L (ref 135–145)

## 2013-01-09 LAB — CBC
HCT: 42.7 % (ref 36.0–46.0)
Hemoglobin: 14 g/dL (ref 12.0–15.0)
RBC: 4.97 MIL/uL (ref 3.87–5.11)
RDW: 13.9 % (ref 11.5–15.5)
WBC: 7.9 10*3/uL (ref 4.0–10.5)

## 2013-01-09 MED ORDER — CHLORHEXIDINE GLUCONATE 4 % EX LIQD: 1.0000 "application " | Freq: Once | CUTANEOUS | Status: DC

## 2013-01-09 NOTE — Pre-Procedure Instructions (Signed)
April Lane  01/09/2013   Your procedure is scheduled on:  Mon, Dec 8 @ 10:00 AM  Report to [facility] Short Stay Entrance A at 8:00 AM.  Call this number if you have problems the morning of surgery: [phone]   Remember:   Do not eat food or drink liquids after midnight.   Take these medicines the morning of surgery with A SIP OF WATER: Amlodipine(Norvasc)                          Stop taking your Aspirin,Diclofenac gel,and Naproxen. No Goody's,BC's,Ibuprofen,Fish Oil,or any Herbal Medications   Do not wear jewelry, make-up or nail polish.  Do not wear lotions, powders, or perfumes. You may wear deodorant.  Do not shave 48 hours prior to surgery.   Do not bring valuables to the hospital.  [facility] is not responsible                  for any belongings or valuables.               Contacts, dentures or bridgework may not be worn into surgery.  Leave suitcase in the car. After surgery it may be brought to your room.  For patients admitted to the hospital, discharge time is determined by your                treatment team.               Patients discharged the day of surgery will not be allowed to drive  home.    Special Instructions: Shower using CHG 2 nights before surgery and the night before surgery.  If you shower the day of surgery use CHG.  Use special wash - you have one bottle of CHG for all showers.  You should use approximately 1/3 of the bottle for each shower.   Please read over the following fact sheets that you were given: Pain Booklet, Coughing and Deep Breathing and Surgical Site Infection Prevention

## 2013-01-09 NOTE — Progress Notes (Addendum)
Saw a cardiologist in 2010-d/t being stressed out from a job;said could not find anything wrong-only saw him this one time and didn't require follow up but doesn't have a clue who she saw  Denies ever having an echo/stress test/ heart cath   Denies CXR in past yr  EKG in epic from 12-01-12  Medical Md is Dr.Camille Mardelle Matte

## 2013-01-10 NOTE — Progress Notes (Signed)
Anesthesia chart review:  Patient is a 72 year old female scheduled for laparoscopic cholecystectomy on 01/14/13 by Dr. Carolynne Edouard.  History includes non-smoker, diverticulitis, arthritis, HTN, GI bleed, peptic ulcer, history of transfusion, cataract, fatigue.  She was admitted to [facility] on 12/01/12 with N/V, abdominal pain, and leukocytosis and was diagnosed with acute cholelithiasis.  Cholecystectomy was recommended at that time, but she declined and left AMA with plans for antibiotics and close follow-up.  PCP is listed as Dr. Asencion Partridge.    EKG on 12/01/12 showed SR, consider right atrial enlargement, right BBB, LVH, consider old lateral infarct.  Computerized interpretation also thought there was ST elevation in inferior leads.  There was some baseline wandering which may have contributed.  The ED physician did not feel there was any acute ST changes.  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 She reported seeing a cardiologist (unknown) in 2010 due to stress but denied having prior cardiac studies such as echo, stress, or cath.  Preoperative CXR and labs noted.  History and prior EKGs from 12/01/12 and 02/06/10 reviewed with anesthesiologist Dr. Chaney Malling. If no new or acute CV symptoms then it is anticipated that she can proceed as planned.  Further evaluation by her assigned anesthesiologist on the day of surgery.  Velna Ochs [facility] Short Stay Center/Anesthesiology Phone [phone] 01/10/2013 4:44 PM

## 2013-01-13 MED ORDER — CEFAZOLIN SODIUM-DEXTROSE 2-3 GM-% IV SOLR
2.0000 g | INTRAVENOUS | Status: AC
  Administered 2013-01-14: 2 g via INTRAVENOUS
  Filled 2013-01-13: qty 50

## 2013-01-14 ENCOUNTER — Encounter (HOSPITAL_COMMUNITY): Payer: Medicare Other | Admitting: Vascular Surgery

## 2013-01-14 ENCOUNTER — Encounter (HOSPITAL_COMMUNITY): Payer: Self-pay | Admitting: *Deleted

## 2013-01-14 ENCOUNTER — Encounter (HOSPITAL_COMMUNITY)
Admission: RE | Disposition: A | Discharge status: Home / Self Care | Payer: Self-pay | Source: Ambulatory Visit | Attending: General Surgery

## 2013-01-14 ENCOUNTER — Observation Stay (HOSPITAL_COMMUNITY)
Admission: RE | Admit: 2013-01-14 | Discharge: 2013-01-15 | Disposition: A | Discharge status: Home / Self Care | Payer: Medicare Other | Source: Ambulatory Visit | Attending: General Surgery | Admitting: General Surgery

## 2013-01-14 ENCOUNTER — Inpatient Hospital Stay (HOSPITAL_COMMUNITY): Payer: Medicare Other

## 2013-01-14 ENCOUNTER — Inpatient Hospital Stay (HOSPITAL_COMMUNITY): Payer: Medicare Other | Admitting: Anesthesiology

## 2013-01-14 DIAGNOSIS — E559 Vitamin D deficiency, unspecified: Secondary | ICD-10-CM | POA: Insufficient documentation

## 2013-01-14 DIAGNOSIS — M810 Age-related osteoporosis without current pathological fracture: Secondary | ICD-10-CM | POA: Insufficient documentation

## 2013-01-14 DIAGNOSIS — M129 Arthropathy, unspecified: Secondary | ICD-10-CM | POA: Insufficient documentation

## 2013-01-14 DIAGNOSIS — R5381 Other malaise: Secondary | ICD-10-CM | POA: Insufficient documentation

## 2013-01-14 DIAGNOSIS — K276 Chronic or unspecified peptic ulcer, site unspecified, with both hemorrhage and perforation: Secondary | ICD-10-CM | POA: Insufficient documentation

## 2013-01-14 DIAGNOSIS — K8010 Calculus of gallbladder with chronic cholecystitis without obstruction: Secondary | ICD-10-CM | POA: Diagnosis present

## 2013-01-14 DIAGNOSIS — I10 Essential (primary) hypertension: Secondary | ICD-10-CM | POA: Insufficient documentation

## 2013-01-14 HISTORY — PX: CHOLECYSTECTOMY: SHX55

## 2013-01-14 SURGERY — LAPAROSCOPIC CHOLECYSTECTOMY WITH INTRAOPERATIVE CHOLANGIOGRAM
Anesthesia: General | Site: Abdomen

## 2013-01-14 MED ORDER — SODIUM CHLORIDE 0.9 % IV SOLN
INTRAVENOUS | Status: DC | PRN
  Administered 2013-01-14: 10:00:00

## 2013-01-14 MED ORDER — LOSARTAN POTASSIUM 50 MG PO TABS
100.0000 mg | ORAL_TABLET | Freq: Every day | ORAL | Status: DC
  Administered 2013-01-15: 100 mg via ORAL
  Filled 2013-01-14 (×3): qty 2

## 2013-01-14 MED ORDER — LABETALOL HCL 5 MG/ML IV SOLN
INTRAVENOUS | Status: DC | PRN
  Administered 2013-01-14: 10 mg via INTRAVENOUS

## 2013-01-14 MED ORDER — MIDAZOLAM HCL 5 MG/5ML IJ SOLN
INTRAMUSCULAR | Status: DC | PRN
  Administered 2013-01-14: 1 mg via INTRAVENOUS

## 2013-01-14 MED ORDER — SODIUM CHLORIDE 0.9 % IR SOLN
Status: DC | PRN
  Administered 2013-01-14: 1000 mL

## 2013-01-14 MED ORDER — FENTANYL CITRATE 0.05 MG/ML IJ SOLN
INTRAMUSCULAR | Status: DC | PRN
  Administered 2013-01-14 (×4): 50 ug via INTRAVENOUS

## 2013-01-14 MED ORDER — ONDANSETRON HCL 4 MG PO TABS: 4.0000 mg | ORAL_TABLET | Freq: Four times a day (QID) | ORAL | Status: DC | PRN

## 2013-01-14 MED ORDER — OXYCODONE-ACETAMINOPHEN 5-325 MG PO TABS
1.0000 | ORAL_TABLET | ORAL | Status: DC | PRN
  Administered 2013-01-14: 1 via ORAL
  Administered 2013-01-14 – 2013-01-15 (×2): 2 via ORAL
  Filled 2013-01-14: qty 2
  Filled 2013-01-14: qty 1
  Filled 2013-01-14: qty 2

## 2013-01-14 MED ORDER — LACTATED RINGERS IV SOLN
INTRAVENOUS | Status: DC | PRN
  Administered 2013-01-14 (×2): via INTRAVENOUS

## 2013-01-14 MED ORDER — NEOSTIGMINE METHYLSULFATE 1 MG/ML IJ SOLN
INTRAMUSCULAR | Status: DC | PRN
  Administered 2013-01-14: 3 mg via INTRAVENOUS

## 2013-01-14 MED ORDER — AMLODIPINE BESYLATE 5 MG PO TABS
5.0000 mg | ORAL_TABLET | Freq: Every day | ORAL | Status: DC
  Administered 2013-01-15: 5 mg via ORAL
  Filled 2013-01-14 (×3): qty 1

## 2013-01-14 MED ORDER — PROPOFOL 10 MG/ML IV BOLUS
INTRAVENOUS | Status: DC | PRN
  Administered 2013-01-14: 130 mg via INTRAVENOUS

## 2013-01-14 MED ORDER — ONDANSETRON HCL 4 MG/2ML IJ SOLN
INTRAMUSCULAR | Status: DC | PRN
  Administered 2013-01-14: 4 mg via INTRAVENOUS

## 2013-01-14 MED ORDER — LIDOCAINE HCL (CARDIAC) 20 MG/ML IV SOLN
INTRAVENOUS | Status: DC | PRN
  Administered 2013-01-14: 80 mg via INTRAVENOUS

## 2013-01-14 MED ORDER — LACTATED RINGERS IV SOLN
Freq: Once | INTRAVENOUS | Status: AC
  Administered 2013-01-14: 09:00:00 via INTRAVENOUS

## 2013-01-14 MED ORDER — KCL IN DEXTROSE-NACL 20-5-0.9 MEQ/L-%-% IV SOLN
INTRAVENOUS | Status: DC
  Administered 2013-01-14: 13:00:00 via INTRAVENOUS
  Filled 2013-01-14 (×4): qty 1000

## 2013-01-14 MED ORDER — BUPIVACAINE-EPINEPHRINE 0.25% -1:200000 IJ SOLN
INTRAMUSCULAR | Status: DC | PRN
  Administered 2013-01-14: 30 mL

## 2013-01-14 MED ORDER — ROCURONIUM BROMIDE 100 MG/10ML IV SOLN
INTRAVENOUS | Status: DC | PRN
  Administered 2013-01-14: 40 mg via INTRAVENOUS

## 2013-01-14 MED ORDER — MORPHINE SULFATE 4 MG/ML IJ SOLN: 4.0000 mg | INTRAMUSCULAR | Status: DC | PRN

## 2013-01-14 MED ORDER — ONDANSETRON HCL 4 MG/2ML IJ SOLN
4.0000 mg | Freq: Four times a day (QID) | INTRAMUSCULAR | Status: DC | PRN
  Administered 2013-01-15 (×2): 4 mg via INTRAVENOUS
  Filled 2013-01-14 (×2): qty 2

## 2013-01-14 MED ORDER — PHENYLEPHRINE HCL 10 MG/ML IJ SOLN
INTRAMUSCULAR | Status: DC | PRN
  Administered 2013-01-14 (×3): 80 ug via INTRAVENOUS

## 2013-01-14 MED ORDER — GLYCOPYRROLATE 0.2 MG/ML IJ SOLN
INTRAMUSCULAR | Status: DC | PRN
  Administered 2013-01-14: 0.4 mg via INTRAVENOUS

## 2013-01-14 SURGICAL SUPPLY — 39 items
ADH SKN CLS APL DERMABOND .7 (GAUZE/BANDAGES/DRESSINGS) ×1
APPLIER CLIP ROT 10 11.4 M/L (STAPLE) ×2
APR CLP MED LRG 11.4X10 (STAPLE) ×1
BAG SPEC RTRVL LRG 6X4 10 (ENDOMECHANICALS) ×1
BLADE SURG ROTATE 9660 (MISCELLANEOUS) IMPLANT
CANISTER SUCTION 2500CC (MISCELLANEOUS) ×2 IMPLANT
CATH REDDICK CHOLANGI 4FR 50CM (CATHETERS) ×2 IMPLANT
CHLORAPREP W/TINT 26ML (MISCELLANEOUS) ×2 IMPLANT
CLIP APPLIE ROT 10 11.4 M/L (STAPLE) ×1 IMPLANT
COVER MAYO STAND STRL (DRAPES) ×2 IMPLANT
COVER SURGICAL LIGHT HANDLE (MISCELLANEOUS) ×2 IMPLANT
DECANTER SPIKE VIAL GLASS SM (MISCELLANEOUS) ×3 IMPLANT
DERMABOND ADVANCED (GAUZE/BANDAGES/DRESSINGS) ×1
DERMABOND ADVANCED .7 DNX12 (GAUZE/BANDAGES/DRESSINGS) ×1 IMPLANT
DRAPE C-ARM 42X72 X-RAY (DRAPES) ×2 IMPLANT
DRAPE UTILITY 15X26 W/TAPE STR (DRAPE) ×4 IMPLANT
ELECT REM PT RETURN 9FT ADLT (ELECTROSURGICAL) ×2
ELECTRODE REM PT RTRN 9FT ADLT (ELECTROSURGICAL) ×1 IMPLANT
GLOVE BIO SURGEON STRL SZ7.5 (GLOVE) ×2 IMPLANT
GLOVE BIOGEL PI IND STRL 7.0 (GLOVE) IMPLANT
GLOVE BIOGEL PI INDICATOR 7.0 (GLOVE) ×2
GOWN STRL NON-REIN LRG LVL3 (GOWN DISPOSABLE) ×7 IMPLANT
IV CATH 14GX2 1/4 (CATHETERS) ×2 IMPLANT
KIT BASIN OR (CUSTOM PROCEDURE TRAY) ×2 IMPLANT
KIT ROOM TURNOVER OR (KITS) ×2 IMPLANT
NS IRRIG 1000ML POUR BTL (IV SOLUTION) ×2 IMPLANT
PAD ARMBOARD 7.5X6 YLW CONV (MISCELLANEOUS) ×2 IMPLANT
POUCH SPECIMEN RETRIEVAL 10MM (ENDOMECHANICALS) ×2 IMPLANT
SCISSORS LAP 5X35 DISP (ENDOMECHANICALS) ×2 IMPLANT
SET IRRIG TUBING LAPAROSCOPIC (IRRIGATION / IRRIGATOR) ×2 IMPLANT
SLEEVE ENDOPATH XCEL 5M (ENDOMECHANICALS) ×2 IMPLANT
SPECIMEN JAR SMALL (MISCELLANEOUS) ×2 IMPLANT
SUT MNCRL AB 4-0 PS2 18 (SUTURE) ×2 IMPLANT
TOWEL OR 17X24 6PK STRL BLUE (TOWEL DISPOSABLE) ×2 IMPLANT
TOWEL OR 17X26 10 PK STRL BLUE (TOWEL DISPOSABLE) ×2 IMPLANT
TRAY LAPAROSCOPIC (CUSTOM PROCEDURE TRAY) ×2 IMPLANT
TROCAR XCEL BLUNT TIP 100MML (ENDOMECHANICALS) ×2 IMPLANT
TROCAR XCEL NON-BLD 11X100MML (ENDOMECHANICALS) ×2 IMPLANT
TROCAR XCEL NON-BLD 5MMX100MML (ENDOMECHANICALS) ×2 IMPLANT

## 2013-01-14 NOTE — Transfer of Care (Signed)
Immediate Anesthesia Transfer of Care Note  Patient: April Lane  Procedure(s) Performed: Procedure(s): LAPAROSCOPIC CHOLECYSTECTOMY WITH INTRAOPERATIVE CHOLANGIOGRAM (N/A)  Patient Location: PACU  Anesthesia Type:General  Level of Consciousness: sedated, patient cooperative and responds to stimulation  Airway & Oxygen Therapy: Patient Spontanous Breathing and Patient connected to nasal cannula oxygen  Post-op Assessment: Report given to PACU RN, Post -op Vital signs reviewed and stable and Patient moving all extremities  Post vital signs: Reviewed and stable  Complications: No apparent anesthesia complications

## 2013-01-14 NOTE — Op Note (Signed)
01/14/2013  10:24 AM  PATIENT:  April Lane  72 y.o. female  PRE-OPERATIVE DIAGNOSIS:  cholecystitis with cholelithiasis  POST-OPERATIVE DIAGNOSIS:  cholecystitis with cholelithiasis  PROCEDURE:  Procedure(s): LAPAROSCOPIC CHOLECYSTECTOMY WITH INTRAOPERATIVE CHOLANGIOGRAM (N/A)  SURGEON:  Surgeon(s) and Role:    * Robyne Askew, MD - Primary  PHYSICIAN ASSISTANT:   ASSISTANTS: none   ANESTHESIA:   general  EBL:  Total I/O In: 1350 [I.V.:1350] Out: -   BLOOD ADMINISTERED:none  DRAINS: none   LOCAL MEDICATIONS USED:  MARCAINE     SPECIMEN:  Source of Specimen:  gallbladder  DISPOSITION OF SPECIMEN:  PATHOLOGY  COUNTS:  YES  TOURNIQUET:  * No tourniquets in log *  DICTATION: .Dragon Dictation @opnoteheader @  Procedure: After informed consent was obtained the patient was brought to the operating room and placed in the supine position on the operating room table. After adequate induction of general anesthesia the patient's abdomen was prepped with ChloraPrep allowed to dry and draped in usual sterile manner. The area below the umbilicus was infiltrated with quarter percent  Marcaine. A small incision was made with a 15 blade knife. The incision was carried down through the subcutaneous tissue bluntly with a hemostat and [employer] retractors. The linea alba was identified. The linea alba was incised with a 15 blade knife and each side was grasped with Coker clamps. The preperitoneal space was then probed with a hemostat until the peritoneum was opened and access was gained to the abdominal cavity. A 0 Vicryl pursestring stitch was placed in the fascia surrounding the opening. A Hassan cannula was then placed through the opening and anchored in place with the previously placed Vicryl purse string stitch. The abdomen was insufflated with carbon dioxide without difficulty. A laparoscope was inserted through the [facility] cannula in the right upper quadrant was inspected. Next the  epigastric region was infiltrated with % Marcaine. A small incision was made with a 15 blade knife. A 10 mm port was placed bluntly through this incision into the abdominal cavity under direct vision. Next 2 sites were chosen laterally on the right side of the abdomen for placement of 5 mm ports. Each of these areas was infiltrated with quarter percent Marcaine. Small stab incisions were made with a 15 blade knife. 5 mm ports were then placed bluntly through these incisions into the abdominal cavity under direct vision without difficulty. The gallbladder was inflamed and thick walled and required aspiration before it could be grasped. A blunt grasper was placed through the lateralmost 5 mm port and used to grasp the dome of the gallbladder and elevated anteriorly and superiorly. Another blunt grasper was placed through the other 5 mm port and used to retract the body and neck of the gallbladder. A dissector was placed through the epigastric port and using the electrocautery the peritoneal reflection at the gallbladder neck was opened. Some adhesions were also taken down bluntly along the body of the gallbladder. Blunt dissection was then carried out in this area until the gallbladder neck-cystic duct junction was readily identified and a good window was created. A single clip was placed on the gallbladder neck. A small  ductotomy was made just below the clip with laparoscopic scissors. A 14-gauge Angiocath was then placed through the anterior abdominal wall under direct vision. A Reddick cholangiogram catheter was then placed through the Angiocath and flushed. The catheter was then placed in the cystic duct and anchored in place with a clip. A cholangiogram was obtained that  showed no filling defects good emptying into the duodenum an adequate length on the cystic duct. The anchoring clip and catheters were then removed from the patient. 3 clips were placed proximally on the cystic duct and the duct was divided  between the 2 sets of clips. Posterior to this the cystic artery was identified and again dissected bluntly in a circumferential manner until a good window  was created. 2 clips were placed proximally and one distally on the artery and the artery was divided between the 2 sets of clips. Next a laparoscopic hook cautery device was used to separate the gallbladder from the liver bed. Prior to completely detaching the gallbladder from the liver bed the liver bed was inspected and several small bleeding points were coagulated with the electrocautery until the area was completely hemostatic. The gallbladder was then detached the rest of it from the liver bed without difficulty. A laparoscopic bag was inserted through the epigastric port. The gallbladder was placed within the bag and the bag was sealed. A laparoscope was then moved to the epigastric port. The gallbladder grasper was placed through the [facility] cannula and used to grasp the opening of the bag. The bag with the gallbladder was then removed with the [facility] cannula through the infraumbilical port without difficulty. The fascial defect was then closed with the previously placed Vicryl pursestring stitch as well as with another figure-of-eight 0 Vicryl stitch. The liver bed was inspected again and found to be hemostatic. The abdomen was irrigated with copious amounts of saline until the effluent was clear. The ports were then removed under direct vision without difficulty and were found to be hemostatic. The gas was allowed to escape. The skin incisions were all closed with interrupted 4-0 Monocryl subcuticular stitches. Dermabond dressings were applied. The patient tolerated the procedure well. At the end of the case all needle sponge and instrument counts were correct. The patient was then awakened and taken to recovery in stable condition  PLAN OF CARE: Admit for overnight observation  PATIENT DISPOSITION:  PACU - hemodynamically stable.   Delay start of  Pharmacological VTE agent (>24hrs) due to surgical blood loss or risk of bleeding: no

## 2013-01-14 NOTE — Anesthesia Postprocedure Evaluation (Signed)
  Anesthesia Post-op Note  Patient: April Lane  Procedure(s) Performed: Procedure(s): LAPAROSCOPIC CHOLECYSTECTOMY WITH INTRAOPERATIVE CHOLANGIOGRAM (N/A)  Patient Location: PACU  Anesthesia Type:General  Level of Consciousness: awake  Airway and Oxygen Therapy: Patient Spontanous Breathing  Post-op Pain: mild  Post-op Assessment: Post-op Vital signs reviewed  Post-op Vital Signs: stable  Complications: No apparent anesthesia complications

## 2013-01-14 NOTE — Interval H&P Note (Signed)
History and Physical Interval Note:  01/14/2013 7:34 AM  Jean Rosenthal  has presented today for surgery, with the diagnosis of cholecystitis with cholelithiasis  The various methods of treatment have been discussed with the patient and family. After consideration of risks, benefits and other options for treatment, the patient has consented to  Procedure(s): LAPAROSCOPIC CHOLECYSTECTOMY WITH INTRAOPERATIVE CHOLANGIOGRAM (N/A) as a surgical intervention .  The patient's history has been reviewed, patient examined, no change in status, stable for surgery.  I have reviewed the patient's chart and labs.  Questions were answered to the patient's satisfaction.     TOTH III,PAUL S

## 2013-01-14 NOTE — H&P (View-Only) (Signed)
Patient ID: April Lane, female   DOB: 1941/01/11, 72 y.o.   MRN: [id number]  Chief Complaint  Patient presents with  . Cholelithiasis    HPI April Lane is a 72 y.o. female.  We are asked to see the patient in consultation by Dr. Mardelle Matte to evaluate her for gallstones. The patient is a 72 year old female who was recently hospitalized for a day with cholecystitis with cholelithiasis. The recommendation was for her to stay and have her gallbladder removed but she declined and left the hospital AGAINST MEDICAL ADVICE. She has done well at home. She denies any abdominal pain. Her appetite has been good. She has had some diarrhea. Her CT scan showed large stones in her gallbladder with surrounding inflammation and a thickened gallbladder wall. Her liver functions were normal. HPI  Past Medical History  Diagnosis Date  . HTN (hypertension)   . Fatigue   . Vitamin D deficiency   . Arthritis   . Diverticulitis   . Osteoporosis     Past Surgical History  Procedure Laterality Date  . Tubal ligation      at age 30    Family History  Problem Relation Age of Onset  . Heart attack Father 34    Died of natural causes  . Other Mother 72    Died of old age  . Prostate cancer Father     Social History History  Substance Use Topics  . Smoking status: Never Smoker   . Smokeless tobacco: Not on file  . Alcohol Use: No    No Known Allergies  Current Outpatient Prescriptions  Medication Sig Dispense Refill  . amLODipine (NORVASC) 5 MG tablet Take 5 mg by mouth daily.      Marland Kitchen aspirin 81 MG tablet Take 81 mg by mouth daily.      Alphonsus Sias Pollen 580 MG CAPS Take 1 capsule by mouth daily.       . Cholecalciferol (VITAMIN D3) 5000 UNITS TABS Take 1 tablet by mouth daily.      Marland Kitchen losartan (COZAAR) 100 MG tablet Take 100 mg by mouth daily.      . naproxen sodium (ANAPROX) 220 MG tablet Take 220 mg by mouth as needed.       No current facility-administered medications for this visit.    Review  of Systems Review of Systems  Constitutional: Negative.   HENT: Negative.   Eyes: Negative.   Respiratory: Negative.   Cardiovascular: Negative.   Gastrointestinal: Positive for diarrhea.  Endocrine: Negative.   Genitourinary: Negative.   Musculoskeletal: Negative.   Skin: Negative.   Allergic/Immunologic: Negative.   Neurological: Negative.   Hematological: Negative.   Psychiatric/Behavioral: Negative.     Blood pressure 128/80, pulse 78, temperature 97.2 F (36.2 C), temperature source Temporal, resp. rate 18, height 5\' 4"  (1.626 m), weight 169 lb (76.658 kg).  Physical Exam Physical Exam  Constitutional: She is oriented to person, place, and time. She appears well-developed and well-nourished.  HENT:  Head: Normocephalic and atraumatic.  Eyes: Conjunctivae and EOM are normal. Pupils are equal, round, and reactive to light.  Neck: Normal range of motion. Neck supple.  Cardiovascular: Normal rate, regular rhythm and normal heart sounds.   Pulmonary/Chest: Effort normal and breath sounds normal.  Abdominal: Soft. Bowel sounds are normal. There is no tenderness.  Musculoskeletal: Normal range of motion.  Neurological: She is alert and oriented to person, place, and time.  Skin: Skin is warm and dry.  Psychiatric: She has  a normal mood and affect. Her behavior is normal.    Data Reviewed As above  Assessment    The patient had a significant episode of cholecystitis with cholelithiasis. Her symptoms have resolved on antibiotics. Because of the risk of further painful episodes and possible pancreatitis I think she would benefit from having her gallbladder removed. She would also like to have this done. I've discussed with her in detail the risks and benefits of the operation to remove the gallbladder as well as some of the technical aspects and she understands and wishes to proceed     Plan    Plan for laparoscopic cholecystectomy with intraoperative cholangiogram         TOTH III,Tani Virgo S 12/19/2012, 3:35 PM

## 2013-01-14 NOTE — Anesthesia Preprocedure Evaluation (Addendum)
Anesthesia Evaluation  Patient identified by MRN, date of birth, ID band Patient awake    Reviewed: Allergy & Precautions, H&P , NPO status , Patient's Chart, lab work & pertinent test results  History of Anesthesia Complications (+) history of anesthetic complications  Airway Mallampati: I  Neck ROM: Full    Dental  (+) Teeth Intact   Pulmonary neg pulmonary ROS,  breath sounds clear to auscultation        Cardiovascular hypertension, Rhythm:Regular Rate:Normal     Neuro/Psych    GI/Hepatic PUD,   Endo/Other    Renal/GU      Musculoskeletal   Abdominal   Peds  Hematology   Anesthesia Other Findings   Reproductive/Obstetrics                          Anesthesia Physical Anesthesia Plan  ASA: II  Anesthesia Plan: General   Post-op Pain Management:    Induction: Intravenous  Airway Management Planned: Oral ETT  Additional Equipment:   Intra-op Plan:   Post-operative Plan: Extubation in OR  Informed Consent:   Dental advisory given  Plan Discussed with: CRNA and Surgeon  Anesthesia Plan Comments:         Anesthesia Quick Evaluation

## 2013-01-15 ENCOUNTER — Encounter (HOSPITAL_COMMUNITY): Payer: Self-pay | Admitting: General Surgery

## 2013-01-15 NOTE — Progress Notes (Signed)
1 Day Post-Op  Subjective: No complaints.  Objective: Vital signs in last 24 hours: Temp:  [97.5 F (36.4 C)-98.3 F (36.8 C)] 98.3 F (36.8 C) (12/09 0752) Pulse Rate:  [50-76] 76 (12/09 0752) Resp:  [14-23] 16 (12/09 0752) BP: (96-136)/(53-79) 111/64 mmHg (12/09 0752) SpO2:  [92 %-100 %] 100 % (12/09 0752) Last BM Date: 01/14/13  Intake/Output from previous day: 12/08 0701 - 12/09 0700 In: 2464.8 [I.V.:2464.8] Out: 250 [Urine:250] Intake/Output this shift:    Resp: clear to auscultation bilaterally Cardio: regular rate and rhythm GI: soft, nontender. incisions look good  Lab Results:  No results found for this basename: WBC, HGB, HCT, PLT,  in the last 72 hours BMET No results found for this basename: NA, K, CL, CO2, GLUCOSE, BUN, CREATININE, CALCIUM,  in the last 72 hours PT/INR No results found for this basename: LABPROT, INR,  in the last 72 hours ABG No results found for this basename: PHART, PCO2, PO2, HCO3,  in the last 72 hours  Studies/Results: Dg Cholangiogram Operative  01/14/2013   CLINICAL DATA:  Cholecystitis  EXAM: INTRAOPERATIVE CHOLANGIOGRAM  TECHNIQUE: Cholangiographic images from the C-arm fluoroscopic device were submitted for interpretation post-operatively. Please see the procedural report for the amount of contrast and the fluoroscopy time utilized.  COMPARISON:  CT abdomen/pelvis 12/01/2012  FINDINGS: Cine images from an intraoperative cholangiogram demonstrates cannulation of the cystic duct with retrograde opacification of the common and intrahepatic ducts an antegrade opacification of the common bile duct and a portion of the common pancreatic duct. There is no filling defect to suggest choledocholithiasis. No bile duct dilatation. No stenosis or stricture. Contrast material flows freely into the duodenum. The ampulla of Vater is a mildly prominent.  IMPRESSION: Negative intraoperative cholangiogram as described above.   Electronically Signed   By:  Malachy Moan M.D.   On: 01/14/2013 13:25    Anti-infectives: Anti-infectives   Start     Dose/Rate Route Frequency Ordered Stop   01/14/13 0600  ceFAZolin (ANCEF) IVPB 2 g/50 mL premix     2 g 100 mL/hr over 30 Minutes Intravenous On call to O.R. 01/13/13 1249 01/14/13 0935      Assessment/Plan: Lane/p Procedure(Lane): LAPAROSCOPIC CHOLECYSTECTOMY WITH INTRAOPERATIVE CHOLANGIOGRAM (N/A) Discharge  LOS: 1 day    TOTH April Lane 01/15/2013

## 2013-01-15 NOTE — Progress Notes (Signed)
Patient discharged to home with instructions. 

## 2013-01-15 NOTE — Discharge Summary (Signed)
Physician Discharge Summary  Patient ID: April Lane MRN: [id number] DOB/AGE: 1940/04/15 72 y.o.  Admit date: 01/14/2013 Discharge date: 01/15/2013  Admission Diagnoses:  Discharge Diagnoses:  Active Problems:   Cholecystitis with cholelithiasis   Discharged Condition: good  Hospital Course: the pt underwent lap chole. She tolerated surgery well. On pod 1 she was ready for discharge home  Consults: None  Significant Diagnostic Studies: none  Treatments: surgery: as above  Discharge Exam: Blood pressure 111/64, pulse 76, temperature 98.3 F (36.8 C), temperature source Oral, resp. rate 16, SpO2 100.00%. GI: soft, nontender  Disposition: 01-Home or Self Care  Discharge Orders   Future Appointments Provider Department Dept Phone   01/29/2013 9:00 AM Robyne Askew, MD [facility] Surgery, [place] [phone]   Future Orders Complete By Expires   Call MD for:  difficulty breathing, headache or visual disturbances  As directed    Call MD for:  extreme fatigue  As directed    Call MD for:  hives  As directed    Call MD for:  persistant dizziness or light-headedness  As directed    Call MD for:  persistant nausea and vomiting  As directed    Call MD for:  redness, tenderness, or signs of infection (pain, swelling, redness, odor or green/yellow discharge around incision site)  As directed    Call MD for:  severe uncontrolled pain  As directed    Call MD for:  temperature >100.4  As directed    Diet - low sodium heart healthy  As directed    Discharge instructions  As directed    Comments:     May shower. Low fat diet. No heavy lifting   Increase activity slowly  As directed    No wound care  As directed        Medication List         amLODipine 5 MG tablet  Commonly known as:  NORVASC  Take 5 mg by mouth daily.     aspirin 81 MG tablet  Take 81 mg by mouth daily.     Bee Pollen 580 MG Caps  Take 2 capsules by mouth daily.     diclofenac sodium 1 % Gel   Commonly known as:  VOLTAREN  Apply 2 g topically 4 (four) times daily as needed (for pain/stiffness).     losartan 100 MG tablet  Commonly known as:  COZAAR  Take 100 mg by mouth daily.     naproxen sodium 220 MG tablet  Commonly known as:  ANAPROX  Take 220 mg by mouth as needed.     Vitamin D3 5000 UNITS Tabs  Take 1 tablet by mouth daily.           Follow-up Information   Follow up with TOTH III,PAUL S, MD In 3 weeks.   Specialty:  General Surgery   Contact information:   [address] [place] [address] [phone]       Signed: Robyne Askew 01/15/2013, 8:54 AM

## 2013-01-29 ENCOUNTER — Ambulatory Visit (INDEPENDENT_AMBULATORY_CARE_PROVIDER_SITE_OTHER): Payer: Medicare Other | Admitting: General Surgery

## 2013-01-29 ENCOUNTER — Encounter (INDEPENDENT_AMBULATORY_CARE_PROVIDER_SITE_OTHER): Payer: Self-pay | Admitting: General Surgery

## 2013-01-29 VITALS — BP 110/74 | HR 64 | Temp 98.7°F | Resp 14 | Ht 63.0 in | Wt 163.8 lb

## 2013-01-29 DIAGNOSIS — K8010 Calculus of gallbladder with chronic cholecystitis without obstruction: Secondary | ICD-10-CM

## 2013-01-29 NOTE — Progress Notes (Signed)
Subjective:     Patient ID: April Lane, female   DOB: [birth year], 72 y.o.   MRN: [id number]  HPI The patient is a 72 year old black female who is about 3 weeks status post laparoscopic cholecystectomy for cholecystitis with cholelithiasis. She is doing very well and denies any abdominal pain. She continues to watch her diet.  Review of Systems     Objective:   Physical Exam On exam her abdomen is soft and nontender. Her incisions are all healing nicely with no sign of infection.    Assessment:     The patient is 3 weeks status post laparoscopic cholecystectomy     Plan:     At this point she may return to her normal activities. I would avoid heavy lifting for another 2-3 weeks. We will plan to see her back on a when necessary basis

## 2013-01-29 NOTE — Patient Instructions (Signed)
May return to normal activities 

## 2013-08-15 ENCOUNTER — Other Ambulatory Visit: Payer: Self-pay | Admitting: Family Medicine

## 2013-08-15 DIAGNOSIS — E2839 Other primary ovarian failure: Secondary | ICD-10-CM

## 2013-08-26 ENCOUNTER — Ambulatory Visit
Admission: RE | Admit: 2013-08-26 | Discharge: 2013-08-26 | Disposition: A | Discharge status: Home / Self Care | Payer: Medicare Other | Source: Ambulatory Visit | Attending: Family Medicine | Admitting: Family Medicine

## 2013-08-26 ENCOUNTER — Encounter (INDEPENDENT_AMBULATORY_CARE_PROVIDER_SITE_OTHER): Payer: Self-pay

## 2013-08-26 DIAGNOSIS — E2839 Other primary ovarian failure: Secondary | ICD-10-CM

## 2015-05-09 ENCOUNTER — Observation Stay (HOSPITAL_COMMUNITY): Payer: Medicare Other

## 2015-05-09 ENCOUNTER — Observation Stay (HOSPITAL_COMMUNITY)
Admission: EM | Admit: 2015-05-09 | Discharge: 2015-05-10 | Disposition: A | Discharge status: Home / Self Care | Payer: Medicare Other | Attending: Oncology | Admitting: Oncology

## 2015-05-09 ENCOUNTER — Encounter (HOSPITAL_COMMUNITY): Payer: Self-pay | Admitting: Emergency Medicine

## 2015-05-09 DIAGNOSIS — I10 Essential (primary) hypertension: Secondary | ICD-10-CM | POA: Diagnosis not present

## 2015-05-09 DIAGNOSIS — R112 Nausea with vomiting, unspecified: Secondary | ICD-10-CM | POA: Insufficient documentation

## 2015-05-09 DIAGNOSIS — Z8711 Personal history of peptic ulcer disease: Secondary | ICD-10-CM | POA: Diagnosis not present

## 2015-05-09 DIAGNOSIS — Z7982 Long term (current) use of aspirin: Secondary | ICD-10-CM | POA: Insufficient documentation

## 2015-05-09 DIAGNOSIS — K625 Hemorrhage of anus and rectum: Secondary | ICD-10-CM | POA: Diagnosis present

## 2015-05-09 DIAGNOSIS — K921 Melena: Secondary | ICD-10-CM | POA: Diagnosis not present

## 2015-05-09 DIAGNOSIS — K529 Noninfective gastroenteritis and colitis, unspecified: Secondary | ICD-10-CM | POA: Insufficient documentation

## 2015-05-09 DIAGNOSIS — Z8249 Family history of ischemic heart disease and other diseases of the circulatory system: Secondary | ICD-10-CM | POA: Diagnosis not present

## 2015-05-09 DIAGNOSIS — K5730 Diverticulosis of large intestine without perforation or abscess without bleeding: Secondary | ICD-10-CM | POA: Diagnosis not present

## 2015-05-09 LAB — COMPREHENSIVE METABOLIC PANEL
ALBUMIN: 3.7 g/dL (ref 3.5–5.0)
ALT: 12 U/L — AB (ref 14–54)
AST: 23 U/L (ref 15–41)
Alkaline Phosphatase: 75 U/L (ref 38–126)
Anion gap: 9 (ref 5–15)
BUN: 14 mg/dL (ref 6–20)
CHLORIDE: 105 mmol/L (ref 101–111)
CO2: 24 mmol/L (ref 22–32)
Calcium: 10.5 mg/dL — ABNORMAL HIGH (ref 8.9–10.3)
Creatinine, Ser: 0.81 mg/dL (ref 0.44–1.00)
GFR calc Af Amer: >60 mL/min (ref >60)
GFR calc non Af Amer: >60 mL/min (ref >60)
GLUCOSE: 113 mg/dL — AB (ref 65–99)
Potassium: 3.8 mmol/L (ref 3.5–5.1)
SODIUM: 138 mmol/L (ref 135–145)
Total Bilirubin: 0.9 mg/dL (ref 0.3–1.2)
Total Protein: 6.9 g/dL (ref 6.5–8.1)

## 2015-05-09 LAB — TYPE AND SCREEN
ABO/RH(D): A POS
ANTIBODY SCREEN: NEGATIVE

## 2015-05-09 LAB — CBC
HCT: 45.1 % (ref 36.0–46.0)
Hemoglobin: 14.6 g/dL (ref 12.0–15.0)
MCH: 28 pg (ref 26.0–34.0)
MCHC: 32.4 g/dL (ref 30.0–36.0)
MCV: 86.4 fL (ref 78.0–100.0)
PLATELETS: 263 10*3/uL (ref 150–400)
RBC: 5.22 MIL/uL — ABNORMAL HIGH (ref 3.87–5.11)
RDW: 14 % (ref 11.5–15.5)
WBC: 11.5 10*3/uL — AB (ref 4.0–10.5)

## 2015-05-09 LAB — POC OCCULT BLOOD, ED: Fecal Occult Bld: POSITIVE — AB

## 2015-05-09 LAB — ABO/RH: ABO/RH(D): A POS

## 2015-05-09 MED ORDER — ONDANSETRON HCL 4 MG/2ML IJ SOLN: 4.0000 mg | Freq: Three times a day (TID) | INTRAMUSCULAR | Status: DC | PRN

## 2015-05-09 MED ORDER — KCL IN DEXTROSE-NACL 40-5-0.9 MEQ/L-%-% IV SOLN: INTRAVENOUS | Status: DC

## 2015-05-09 MED ORDER — IOPAMIDOL (ISOVUE-300) INJECTION 61%
INTRAVENOUS | Status: AC
  Filled 2015-05-09: qty 100

## 2015-05-09 MED ORDER — ONDANSETRON HCL 4 MG PO TABS: 4.0000 mg | ORAL_TABLET | Freq: Four times a day (QID) | ORAL | Status: DC | PRN

## 2015-05-09 MED ORDER — KCL IN DEXTROSE-NACL 40-5-0.9 MEQ/L-%-% IV SOLN
INTRAVENOUS | Status: AC
  Administered 2015-05-09: 21:00:00 via INTRAVENOUS
  Filled 2015-05-09: qty 1000

## 2015-05-09 MED ORDER — ONDANSETRON HCL 4 MG/2ML IJ SOLN: 4.0000 mg | Freq: Four times a day (QID) | INTRAMUSCULAR | Status: DC | PRN

## 2015-05-09 NOTE — ED Notes (Signed)
Attempted report 

## 2015-05-09 NOTE — ED Notes (Signed)
Admitting MD at bedside, CT to return to transport pt to CT in 10 mins, pt aware of plan

## 2015-05-09 NOTE — Discharge Summary (Signed)
Name: April Lane MRN: [id number] DOB: 1940-06-22 75 y.o. PCP: Leamon Arnt, MD  Date of Admission: 05/09/2015 10:36 AM Date of Discharge: 05/10/2015 Attending Physician: Annia Belt, MD  Discharge Diagnosis: 1. Loose bloody stools   Discharge Medications:   Medication List    TAKE these medications        acetaminophen 325 MG tablet  Commonly known as:  TYLENOL  Take 650 mg by mouth every 6 (six) hours as needed for mild pain.     amLODipine 5 MG tablet  Commonly known as:  NORVASC  Take 5 mg by mouth daily.     aspirin 81 MG tablet  Take 81 mg by mouth daily.     Bee Pollen 580 MG Caps  Take 2 capsules by mouth daily.     diclofenac sodium 1 % Gel  Commonly known as:  VOLTAREN  Apply 2 g topically 4 (four) times daily as needed (for pain/stiffness).     losartan 100 MG tablet  Commonly known as:  COZAAR  Take 100 mg by mouth daily.     meclizine 25 MG tablet  Commonly known as:  ANTIVERT  Take 25 mg by mouth 3 (three) times daily as needed for dizziness.     Vitamin D3 5000 units Tabs  Take 1 tablet by mouth daily.       Disposition and follow-up:   [name] was discharged from [facility] in Good condition.  At the hospital follow up visit please address:  Her loose bloody stools have resolved  Follow-up Appointments: Call your primary doctor for an appointment in the next 2 weeks  Admission HPI:  Ms. 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  Yesterday morning, she ate oatmeal at [employer]; about 6 hours later, she became nauseous and started throwing up. Later that afternoon, she started having significant loose stools with some abdominal cramping. That evening, she had a significant amount of blood in one of her stools. She then had another bowel movement that had much less blood after that and her abdominal pain has  resolved. Now her appetite has returned and she is feeling much better. This has never happened to her in the past. She has not had a colonoscopy because she does not want any cancer screening. She denies any fever, rash, chest pain, shortness of breath; review of systems was otherwise non-revealing.  Hospital Course by problem list:   1. Loose bloody stools: She presented with 1 day of loose bloody stools after eating oatmeal at [employer] earlier that morning. On admission she was hemodynamically stable with a normal hemoglobin. We wondered whether this could have been EHEC or Campylobacte but decided against ordering a stool pathogen panel because her loose stools resolved spontaneously. We gently re-hydrated her overnight. On the day of discharge, her loose stools had resolved and she was eating and drinking without any nausea.  Discharge Vitals:   BP 108/71 mmHg  Pulse 78  Temp(Src) 98.4 F (36.9 C) (Oral)  Resp 18  SpO2 99%  Discharge Labs:  Results for orders placed or performed during the hospital encounter of 05/09/15 (from the past 24 hour(s))  Comprehensive metabolic panel     Status: Abnormal   Collection Time: 05/09/15 11:10 AM  Result Value Ref Range   Sodium 138 135 - 145 mmol/L   Potassium 3.8 3.5 - 5.1  mmol/L   Chloride 105 101 - 111 mmol/L   CO2 24 22 - 32 mmol/L   Glucose, Bld 113 (H) 65 - 99 mg/dL   BUN 14 6 - 20 mg/dL   Creatinine, Ser 0.81 0.44 - 1.00 mg/dL   Calcium 10.5 (H) 8.9 - 10.3 mg/dL   Total Protein 6.9 6.5 - 8.1 g/dL   Albumin 3.7 3.5 - 5.0 g/dL   AST 23 15 - 41 U/L   ALT 12 (L) 14 - 54 U/L   Alkaline Phosphatase 75 38 - 126 U/L   Total Bilirubin 0.9 0.3 - 1.2 mg/dL   GFR calc non Af Amer >60 >60 mL/min   GFR calc Af Amer >60 >60 mL/min   Anion gap 9 5 - 15  CBC     Status: Abnormal   Collection Time: 05/09/15 11:10 AM  Result Value Ref Range   WBC 11.5 (H) 4.0 - 10.5 K/uL   RBC 5.22 (H) 3.87 - 5.11 MIL/uL   Hemoglobin 14.6 12.0 - 15.0 g/dL    HCT 45.1 36.0 - 46.0 %   MCV 86.4 78.0 - 100.0 fL   MCH 28.0 26.0 - 34.0 pg   MCHC 32.4 30.0 - 36.0 g/dL   RDW 14.0 11.5 - 15.5 %   Platelets 263 150 - 400 K/uL  Type and screen [place]     Status: None   Collection Time: 05/09/15 11:10 AM  Result Value Ref Range   ABO/RH(D) A POS    Antibody Screen NEG    Sample Expiration 05/12/2015   ABO/Rh     Status: None   Collection Time: 05/09/15 11:10 AM  Result Value Ref Range   ABO/RH(D) A POS   POC occult blood, ED     Status: Abnormal   Collection Time: 05/09/15  3:21 PM  Result Value Ref Range   Fecal Occult Bld POSITIVE (A) NEGATIVE    Signed: Loleta Chance, MD 05/10/2015, 10:14 AM

## 2015-05-09 NOTE — ED Provider Notes (Signed)
CSN: [id number]     Arrival date & time 05/09/15  1032 History   First MD Initiated Contact with Patient 05/09/15 1048     Chief Complaint  Patient presents with  . Rectal Bleeding      HPI Pt from home with c/o diarrhea starting yesterday turning into bloody stools last night and this morning. Pt reports severe generalized abdominal pain last night, not as severe today. Pt ambulatory in room, NAD, A&O. Past Medical History  Diagnosis Date  . Fatigue   . Vitamin D deficiency   . Arthritis   . Diverticulitis   . Osteoporosis     steroid injection right knee a few yrs ago  . HTN (hypertension)     takes Amlodipine and Losartan daily  . Peptic ulcer   . GI bleed     many yrs ago  . History of blood transfusion     no abnormal reactions  . Cataract     left eye immature   Past Surgical History  Procedure Laterality Date  . Tubal ligation      at age 78  . Cholecystectomy  01/14/13  . Cholecystectomy N/A 01/14/2013    Procedure: LAPAROSCOPIC CHOLECYSTECTOMY WITH INTRAOPERATIVE CHOLANGIOGRAM;  Surgeon: Merrie Roof, MD;  Location: [facility] OR;  Service: General;  Laterality: N/A;   Family History  Problem Relation Age of Onset  . Heart attack Father 47    Died of natural causes  . Other Mother 5    Died of old age  . Prostate cancer Father    Social History  Substance Use Topics  . Smoking status: Never Smoker   . Smokeless tobacco: Never Used  . Alcohol Use: No   OB History    No data available     Review of Systems  Constitutional: Positive for chills.  Gastrointestinal: Positive for diarrhea and blood in stool.      Allergies  Review of patient's allergies indicates no known allergies.  Home Medications   Prior to Admission medications   Medication Sig Start Date End Date Taking? Authorizing Provider  acetaminophen (TYLENOL) 325 MG tablet Take 650 mg by mouth every 6 (six) hours as needed for mild pain.   Yes Historical Provider, MD  amLODipine (NORVASC)  5 MG tablet Take 5 mg by mouth daily.   Yes Historical Provider, MD  aspirin 81 MG tablet Take 81 mg by mouth daily.   Yes Historical Provider, MD  Bee Pollen 580 MG CAPS Take 2 capsules by mouth daily.    Yes Historical Provider, MD  Cholecalciferol (VITAMIN D3) 5000 UNITS TABS Take 1 tablet by mouth daily.   Yes Historical Provider, MD  diclofenac sodium (VOLTAREN) 1 % GEL Apply 2 g topically 4 (four) times daily as needed (for pain/stiffness).   Yes Historical Provider, MD  losartan (COZAAR) 100 MG tablet Take 100 mg by mouth daily.   Yes Historical Provider, MD  meclizine (ANTIVERT) 25 MG tablet Take 25 mg by mouth 3 (three) times daily as needed for dizziness.  04/15/15  Yes Historical Provider, MD   BP 108/71 mmHg  Pulse 78  Temp(Src) 98.4 F (36.9 C) (Oral)  Resp 18  SpO2 99% Physical Exam  Constitutional: She is oriented to person, place, and time. She appears well-developed and well-nourished. No distress.  HENT:  Head: Normocephalic and atraumatic.  Eyes: Pupils are equal, round, and reactive to light.  Neck: Normal range of motion.  Cardiovascular: Normal rate and intact distal pulses.  Pulmonary/Chest: No respiratory distress.  Abdominal: Soft. Normal appearance and bowel sounds are normal. She exhibits no distension. There is no tenderness. There is no rebound and no guarding.  Musculoskeletal: Normal range of motion.  Neurological: She is alert and oriented to person, place, and time. No cranial nerve deficit.  Skin: Skin is warm and dry. No rash noted.  Psychiatric: She has a normal mood and affect. Her behavior is normal.  Nursing note and vitals reviewed.   ED Course  Procedures (including critical care time) Labs Review Labs Reviewed  COMPREHENSIVE METABOLIC PANEL - Abnormal; Notable for the following:    Glucose, Bld 113 (*)    Calcium 10.5 (*)    ALT 12 (*)    All other components within normal limits  CBC - Abnormal; Notable for the following:    WBC 11.5  (*)    RBC 5.22 (*)    All other components within normal limits  POC OCCULT BLOOD, ED - Abnormal; Notable for the following:    Fecal Occult Bld POSITIVE (*)    All other components within normal limits  TYPE AND SCREEN  ABO/RH    Imaging Review MPRESSION: Distal colonic diverticulosis without evidence of diverticulitis.  Wall thickening of the distal transverse through proximal sigmoid colon, centered at splenic flexure, consistent with colitis ; differential diagnosis includes infection, inflammatory bowel disease, and ischemia.  Small amount of nonspecific free pelvic fluid.  Uterine leiomyomata.   Electronically Signed By: Lavonia Dana M.D. On: 05/09/2015 15:56    MDM   Final diagnoses:  Rectal bleeding        Leonard Schwartz, MD 05/14/15 [phone]

## 2015-05-09 NOTE — ED Notes (Signed)
Pt from home with c/o diarrhea starting yesterday turning into bloody stools last night and this morning. Pt reports severe generalized abdominal pain last night, not as severe today.  Pt ambulatory in room, NAD, A&O.

## 2015-05-09 NOTE — H&P (Signed)
Date: 05/09/2015               Patient Name:  April Lane MRN: [id number]  DOB: 1940-05-01 Age / Sex: 75 y.o., female   PCP: Leamon Arnt, MD         Medical Service: Internal Medicine Teaching Service         Attending Physician: Dr. Annia Belt, MD    First Contact: Dr. Loleta Chance Pager: [phone]  Second Contact: Dr. Fayrene Fearing patel Pager: [phone]       After Hours (After 5p/  First Contact Pager: [phone]  weekends / holidays): Second Contact Pager: [phone]   Chief Complaint: "I had bloody diarrhea."  History of Present Illness:  April Lane is a 75 year old lady with hypertension and a peptic ulcer managed conservatively back in the 1990s presenting with bloody loose stools.  Yesterday morning, she ate oatmeal at [employer]; about 6 hours later, she became nauseous and started throwing up. Later that afternoon, she started having significant loose stools with some abdominal cramping. That evening, she had a significant amount of blood in one of her stools. She then had another bowel movement that had much less blood after that and her abdominal pain has resolved. Now her appetite has returned and she is feeling much better. This has never happened to her in the past. She has not had a colonoscopy because she does not want any cancer screening. She denies any fever, rash, chest pain, shortness of breath; review of systems was otherwise non-revealing.  Medications: Amlodipine 5mg  daily Aspirin 91mg  daily Losartan 100mg  daily Acetaminophen 650mg  every 6 hours as needed for pain  Allergies: No known allergies  Past medical history: Hypertension Peptic ulcer many years ago without endoscopy on file  Family history: Father died of myocardial infarction at 77 year ols Mother died of old age at 51 No history of cancer in the family  Social history: She works as a Quarry manager No smoking, alcohol, or illicit drugs  Review of Systems: Per HPI  Physical Exam: Blood pressure  122/79, pulse 82, temperature 97.9 F (36.6 C), temperature source Oral, resp. rate 18, SpO2 100 %.   General: very friendly lady resting in bed comfortably, appropriately conversational HEENT: no scleral icterus, extra-ocular muscles intact, oropharynx without lesions Cardiac: regular rate and rhythm, no rubs, murmurs or gallops Pulm: breathing well, clear to auscultation bilaterally Abd: surgical scars from laparoscopic cholecystectomy, bowel sounds normal, soft, nondistended, non-tender Ext: warm and well perfused, without pedal edema Lymph: no cervical or supraclavicular lymphadenopathy Skin: no rash, hair, or nail changes Neuro: alert and oriented X3, cranial nerves II-XII grossly intact, moving all extremities well  Lab results: Basic Metabolic Panel:  Recent Labs  05/09/15 1110  NA 138  K 3.8  CL 105  CO2 24  GLUCOSE 113*  BUN 14  CREATININE 0.81  CALCIUM 10.5*   Liver Function Tests:  Recent Labs  05/09/15 1110  AST 23  ALT 12*  ALKPHOS 75  BILITOT 0.9  PROT 6.9  ALBUMIN 3.7   CBC:  Recent Labs  05/09/15 1110  WBC 11.5*  HGB 14.6  HCT 45.1  MCV 86.4  PLT 263    Assessment & Plan by Problem:  April Lane is a 75 year old lady with hypertension and peptic ulcer disease presenting with loose bloody stools after eating at [facility]. I think this is likely infectious, perhaps EHEC, Campylobacter jejunum, or Shigella. I doubt a parasitic infection as she has not traveled  recently. Given the concomitant loose stools, I doubt this is peptic ulcer disease or diverticulosis.  Loose bloody stools: Per above, she is hemodynamically stable, her hemoglobin is normal at 14.6, and she is improving spontaneously. I think this is likely infectious. Should she continue to have bloody stools, we can send off for stool culture. -Rehydrating with D5-NS with potassium at 47mL/hr for 12 hours -We can get a stool culture if her bloody bowel movements persist  Hypertension:  Pressures look good in 130s.Dispo: Disposition is deferred at this time, awaiting improvement of current medical problem -Holding home amlodipine 5mg  daily -Holding home losartan 100mg  daily  s. Anticipated discharge in approximately 1 day(s).   The patient does have a current PCP Leamon Arnt, MD) and does need an [facility] hospital follow-up appointment after discharge.  The patient does not have transportation limitations that hinder transportation to clinic appointments.  Signed: Loleta Chance, MD 05/09/2015, 2:05 PM

## 2015-05-10 DIAGNOSIS — K5289 Other specified noninfective gastroenteritis and colitis: Secondary | ICD-10-CM | POA: Diagnosis not present

## 2015-05-10 DIAGNOSIS — I10 Essential (primary) hypertension: Secondary | ICD-10-CM

## 2015-05-10 NOTE — Progress Notes (Signed)
Patient ID: TALEA WIRTANEN, female   DOB: 01/14/1941, 75 y.o.   MRN: [id number]   Subjective: Ms. Shorr is feeling much better today; she had a well-formed bowel movement with only a tinge of blood. She feels ready and eager to go home today.  Objective: Vital signs in last 24 hours: Filed Vitals:   05/09/15 1700 05/09/15 1800 05/09/15 2231 05/10/15 0702  BP: 127/81 130/90 135/82 108/71  Pulse: 80 82 81 78  Temp:   98.2 F (36.8 C) 98.4 F (36.9 C)  TempSrc:   Oral   Resp: 14 17 18 18   SpO2: 99% 96% 98% 99%   General: resting in bed comfortably, appropriately conversational Cardiac: regular rate and rhythm, no rubs, murmurs or gallops Pulm: breathing well, clear to auscultation bilaterally Abd: bowel sounds normal, soft, nondistended, non-tender Ext: warm and well perfused, without pedal edema Skin: no rash, hair, or nail changes  Studies/Results: Ct Abdomen Pelvis W Contrast  05/09/2015  CLINICAL DATA:  Rectal bleeding, history hypertension EXAM: CT ABDOMEN AND PELVIS WITH CONTRAST TECHNIQUE: Multidetector CT imaging of the abdomen and pelvis was performed using the standard protocol following bolus administration of intravenous contrast. Sagittal and coronal MPR images reconstructed from axial data set. CONTRAST:  100 cc Isovue 300 IV. Dilute oral contrast was not administered. COMPARISON:  12/01/2012 FINDINGS: Minimal dependent atelectasis at lung bases. Gallbladder surgically absent. Tiny probable BILATERAL renal cysts. Liver, spleen, pancreas, kidneys, and adrenal glands otherwise normal appearance. Normal appendix. Bowel all day above the distal transverse through proximal descending colon compatible with colitis. Diverticulosis of descending and sigmoid colon without significant wall thickening. Stomach and bowel loops otherwise normal appearance for exam lacking GI contrast. Small amount of nonspecific free pelvic fluid. Tiny LEFT ovarian cyst 16 x 13 mm. Bladder, ureters, at RIGHT  ovary unremarkable. Uterine leiomyomata largest 3.9 x 3.9 cm. Minimal atherosclerotic calcification. No mass, adenopathy, free air, hernia, or acute osseous findings. IMPRESSION: Distal colonic diverticulosis without evidence of diverticulitis. Wall thickening of the distal transverse through proximal sigmoid colon, centered at splenic flexure, consistent with colitis ; differential diagnosis includes infection, inflammatory bowel disease, and ischemia. Small amount of nonspecific free pelvic fluid. Uterine leiomyomata. Electronically Signed   By: Lavonia Dana M.D.   On: 05/09/2015 15:56   Medications: I have reviewed the patient's current medications. Scheduled Meds:  Continuous Infusions:  PRN Meds:.ondansetron **OR** ondansetron (ZOFRAN) IV   Assessment/Plan:  Loose bloody stools: I think this was most likely infectious and related to the [employer] oatmeal, as further evidenced by her abdominal CT. Fortunately it has spontaneously resolved over the last 24 hours. She is well-hydrated, eating well, and has not had recurrent hematochezia so she is safe to go home. -Safe for discharge today.  Hypertension: Pressures look good overnight. -Continued antihypertensives upon discharge  Dispo: Discharge to home today.  The patient does have a current PCP Leamon Arnt, MD) and does need an [facility] hospital follow-up appointment after discharge.  The patient does not have transportation limitations that hinder transportation to clinic appointments.  .Services Needed at time of discharge: Y = Yes, Blank = No PT:   OT:   RN:   Equipment:   Other:       Loleta Chance, MD 05/10/2015, 10:10 AM

## 2015-05-11 MED ORDER — IOPAMIDOL (ISOVUE-300) INJECTION 61%
100.0000 mL | Freq: Once | INTRAVENOUS | Status: AC | PRN
  Administered 2015-05-09: 100 mL via INTRAVENOUS

## 2016-11-16 ENCOUNTER — Emergency Department (HOSPITAL_COMMUNITY)
Admission: EM | Admit: 2016-11-16 | Discharge: 2016-11-17 | Disposition: A | Discharge status: Home / Self Care | Payer: Medicare Other | Attending: Emergency Medicine | Admitting: Emergency Medicine

## 2016-11-16 DIAGNOSIS — I10 Essential (primary) hypertension: Secondary | ICD-10-CM | POA: Insufficient documentation

## 2016-11-16 DIAGNOSIS — Z7982 Long term (current) use of aspirin: Secondary | ICD-10-CM | POA: Diagnosis not present

## 2016-11-16 DIAGNOSIS — R040 Epistaxis: Secondary | ICD-10-CM | POA: Diagnosis not present

## 2016-11-16 DIAGNOSIS — Z79899 Other long term (current) drug therapy: Secondary | ICD-10-CM | POA: Diagnosis not present

## 2016-11-16 MED ORDER — OXYMETAZOLINE HCL 0.05 % NA SOLN
1.0000 | Freq: Once | NASAL | Status: DC
  Filled 2016-11-16: qty 15

## 2016-11-16 MED ORDER — TRANEXAMIC ACID 1000 MG/10ML IV SOLN
500.0000 mg | Freq: Once | INTRAVENOUS | Status: DC
  Filled 2016-11-16: qty 10

## 2016-11-16 MED ORDER — SILVER NITRATE-POT NITRATE 75-25 % EX MISC
1.0000 | Freq: Once | CUTANEOUS | Status: DC
  Filled 2016-11-16: qty 1

## 2016-11-16 NOTE — ED Provider Notes (Signed)
[facility] DEPT Provider Note   CSN: [id number] Arrival date & time: 11/16/16  2324     History   Chief Complaint Chief Complaint  Patient presents with  . Epistaxis    HPI April Lane is a 76 y.o. female.  Patient home with right-sided nosebleed that onset this evening. States she had one earlier in the day that resolved on its own. She was unable to control the bleeding tonight and EMS was called. Her nose is packed with gauze. Patient states she also had a nosebleed 4 days ago that resolved on its own. She does not take any blood thinners. No history of previous nosebleeds. Denies any dizziness or lightheadedness. No chest pain or shortness of breath. Denies any trauma to her nose   The history is provided by the EMS personnel and the patient.  Epistaxis      Past Medical History:  Diagnosis Date  . Arthritis   . Cataract    left eye immature  . Diverticulitis   . Fatigue   . GI bleed    many yrs ago  . History of blood transfusion    no abnormal reactions  . HTN (hypertension)    takes Amlodipine and Losartan daily  . Osteoporosis    steroid injection right knee a few yrs ago  . Peptic ulcer   . Vitamin D deficiency     Patient Active Problem List   Diagnosis Date Noted  . Rectal bleeding 05/09/2015  . Cholecystitis with cholelithiasis 01/14/2013  . Cholecystitis, acute 12/02/2012  . Leukocytosis, unspecified 12/02/2012  . Abdominal pain, other specified site 12/02/2012  . Nausea with vomiting 12/01/2012    Past Surgical History:  Procedure Laterality Date  . CHOLECYSTECTOMY  01/14/13  . CHOLECYSTECTOMY N/A 01/14/2013   Procedure: LAPAROSCOPIC CHOLECYSTECTOMY WITH INTRAOPERATIVE CHOLANGIOGRAM;  Surgeon: Merrie Roof, MD;  Location: [facility];  Service: General;  Laterality: N/A;  . TUBAL LIGATION     at age 42    OB History    No data available       Home Medications    Prior to Admission medications   Medication Sig Start Date End  Date Taking? Authorizing Provider  acetaminophen (TYLENOL) 325 MG tablet Take 650 mg by mouth every 6 (six) hours as needed for mild pain.    [provider]  amLODipine (NORVASC) 5 MG tablet Take 5 mg by mouth daily.    [provider]  aspirin 81 MG tablet Take 81 mg by mouth daily.    [provider]  Bee Pollen 580 MG CAPS Take 2 capsules by mouth daily.     [provider]  Cholecalciferol (VITAMIN D3) 5000 UNITS TABS Take 1 tablet by mouth daily.    [provider]  diclofenac sodium (VOLTAREN) 1 % GEL Apply 2 g topically 4 (four) times daily as needed (for pain/stiffness).    [provider]  losartan (COZAAR) 100 MG tablet Take 100 mg by mouth daily.    [provider]  meclizine (ANTIVERT) 25 MG tablet Take 25 mg by mouth 3 (three) times daily as needed for dizziness.  04/15/15   [provider]    Family History Family History  Problem Relation Age of Onset  . Heart attack Father 38       Died of natural causes  . Other Mother 1       Died of old age  . Prostate cancer Father     Social  History Social History  Substance Use Topics  . Smoking status: Never Smoker  . Smokeless tobacco: Never Used  . Alcohol use No     Allergies   Patient has no known allergies.   Review of Systems Review of Systems  Constitutional: Negative for activity change, appetite change and fever.  HENT: Positive for nosebleeds.   Respiratory: Negative for cough, chest tightness and shortness of breath.   Cardiovascular: Negative for chest pain.  Gastrointestinal: Negative for abdominal pain, nausea and vomiting.  Genitourinary: Negative for dysuria, hematuria, vaginal bleeding and vaginal discharge.  Musculoskeletal: Negative for arthralgias and myalgias.  Skin: Negative for rash.  Neurological: Negative for dizziness, tremors, light-headedness and headaches.    all other systems are negative except as noted in the HPI  and PMH.    Physical Exam Updated Vital Signs BP 123/76 (BP Location: Right Arm)   Pulse 96   Temp 98.1 F (36.7 C) (Oral)   Resp 18   Ht 5\' 4"  (1.626 m)   Wt 76.2 kg (168 lb)   SpO2 97%   BMI 28.84 kg/m   Physical Exam  Constitutional: She is oriented to person, place, and time. She appears well-developed and well-nourished. No distress.  HENT:  Head: Normocephalic and atraumatic.  Mouth/Throat: Oropharynx is clear and moist. No oropharyngeal exudate.  Blood in posterior pharynx. Packing and large clot removed. Bleeding localized to R nasal septum  Eyes: Pupils are equal, round, and reactive to light. Conjunctivae and EOM are normal.  Neck: Normal range of motion. Neck supple.  No meningismus.  Cardiovascular: Normal rate, regular rhythm, normal heart sounds and intact distal pulses.   No murmur heard. Pulmonary/Chest: Effort normal and breath sounds normal. No respiratory distress.  Abdominal: Soft. There is no tenderness. There is no rebound and no guarding.  Musculoskeletal: Normal range of motion. She exhibits no edema or tenderness.  Neurological: She is alert and oriented to person, place, and time. No cranial nerve deficit. She exhibits normal muscle tone. Coordination normal.   5/5 strength throughout. CN 2-12 intact.Equal grip strength.   Skin: Skin is warm. Capillary refill takes less than 2 seconds.  Psychiatric: She has a normal mood and affect. Her behavior is normal.  Nursing note and vitals reviewed.    ED Treatments / Results  Labs (all labs ordered are listed, but only abnormal results are displayed) Labs Reviewed  CBC WITH DIFFERENTIAL/PLATELET  BASIC METABOLIC PANEL    EKG  EKG Interpretation None       Radiology No results found.  Procedures .Epistaxis Management Date/Time: 11/16/2016 11:58 PM Performed by: Ezequiel Essex Authorized by: Ezequiel Essex   Consent:    Consent obtained:  Verbal   Consent given by:  Patient    Risks discussed:  Bleeding, pain and infection   Alternatives discussed:  No treatment Anesthesia (see MAR for exact dosages):    Anesthesia method:  Topical application   Topical anesthetic:  Epinephrine and lidocaine gel Procedure details:    Treatment site:  R anterior   Treatment method:  Silver nitrate and thrombin   Treatment complexity:  Extensive   Treatment episode: recurring   Post-procedure details:    Assessment:  Bleeding decreased   Patient tolerance of procedure:  Tolerated well, no immediate complications Comments:     Silver nitrate and topical TXA     (including critical care time)  Medications Ordered in ED Medications  tranexamic acid (CYKLOKAPRON) injection 500 mg (not administered)  silver nitrate applicators applicator  1 Stick (not administered)     Initial Impression / Assessment and Plan / ED Course  I have reviewed the triage vital signs and the nursing notes.  Pertinent labs & imaging results that were available during my care of the patient were reviewed by me and considered in my medical decision making (see chart for details).     Patient with nosebleed apparently coming from right septum. She is in no distress with stable vitals and no blood thinner use.  Bleeding controlled on right nasal septum with silver nitrate and topical TXA.  Hemoglobin is stable. No further bleeding on recheck.  Bleeding has resolved after treatment. Blood pressure and heart rate remained stable. Patient will be discharged with ENT follow-up. Return precautions discussed.  Final Clinical Impressions(s) / ED Diagnoses   Final diagnoses:  Right-sided epistaxis    New Prescriptions New Prescriptions   No medications on file     Ezequiel Essex, MD 11/17/16 [phone]

## 2016-11-16 NOTE — ED Triage Notes (Addendum)
Per EMS, pt from home. Pt reports second nose bleed of the night. Nose was bleeding for abotu 25 minutes before it was controlled. Bleeding controlled by EMS with 4x4 gauze. Initial BP by fire 170/100, 150/108 by EMS. Pt denies pain. EMS VS 144/94, HR 98, R 15.

## 2016-11-17 ENCOUNTER — Encounter (HOSPITAL_COMMUNITY): Payer: Self-pay

## 2016-11-17 LAB — CBC WITH DIFFERENTIAL/PLATELET
BASOS ABS: 0 10*3/uL (ref 0.0–0.1)
BASOS PCT: 0 %
Eosinophils Absolute: 0.1 10*3/uL (ref 0.0–0.7)
Eosinophils Relative: 1 %
HEMATOCRIT: 46 % (ref 36.0–46.0)
Hemoglobin: 14.6 g/dL (ref 12.0–15.0)
Lymphocytes Relative: 21 %
Lymphs Abs: 2.3 10*3/uL (ref 0.7–4.0)
MCH: 27.5 pg (ref 26.0–34.0)
MCHC: 31.7 g/dL (ref 30.0–36.0)
MCV: 86.6 fL (ref 78.0–100.0)
MONO ABS: 0.7 10*3/uL (ref 0.1–1.0)
Monocytes Relative: 6 %
NEUTROS ABS: 7.9 10*3/uL — AB (ref 1.7–7.7)
NEUTROS PCT: 72 %
Platelets: 258 10*3/uL (ref 150–400)
RBC: 5.31 MIL/uL — ABNORMAL HIGH (ref 3.87–5.11)
RDW: 15.6 % — AB (ref 11.5–15.5)
WBC: 11.1 10*3/uL — AB (ref 4.0–10.5)

## 2016-11-17 LAB — BASIC METABOLIC PANEL
ANION GAP: 6 (ref 5–15)
BUN: 23 mg/dL — ABNORMAL HIGH (ref 6–20)
CALCIUM: 10.6 mg/dL — AB (ref 8.9–10.3)
CO2: 26 mmol/L (ref 22–32)
Chloride: 105 mmol/L (ref 101–111)
Creatinine, Ser: 0.72 mg/dL (ref 0.44–1.00)
GFR calc non Af Amer: >60 mL/min (ref >60)
Glucose, Bld: 115 mg/dL — ABNORMAL HIGH (ref 65–99)
Potassium: 4.3 mmol/L (ref 3.5–5.1)
Sodium: 137 mmol/L (ref 135–145)

## 2016-11-17 NOTE — ED Notes (Signed)
Bleeding controlled @ this time.

## 2016-11-17 NOTE — Discharge Instructions (Signed)
Hold pressure for 30 minutes if the bleeding recurs. Follow up with the ENT doctor. Return to the ED if bleeding persists after 30 minutes of pressure or you develop new or worsening symptoms.

## 2017-02-07 ENCOUNTER — Emergency Department (HOSPITAL_COMMUNITY)
Admission: EM | Admit: 2017-02-07 | Discharge: 2017-02-07 | Disposition: A | Discharge status: Home / Self Care | Payer: Medicare Other | Attending: Emergency Medicine | Admitting: Emergency Medicine

## 2017-02-07 ENCOUNTER — Other Ambulatory Visit: Payer: Self-pay

## 2017-02-07 ENCOUNTER — Emergency Department (HOSPITAL_COMMUNITY): Payer: Medicare Other

## 2017-02-07 ENCOUNTER — Encounter (HOSPITAL_COMMUNITY): Payer: Self-pay

## 2017-02-07 DIAGNOSIS — Z79899 Other long term (current) drug therapy: Secondary | ICD-10-CM | POA: Insufficient documentation

## 2017-02-07 DIAGNOSIS — Z7982 Long term (current) use of aspirin: Secondary | ICD-10-CM | POA: Insufficient documentation

## 2017-02-07 DIAGNOSIS — R079 Chest pain, unspecified: Secondary | ICD-10-CM | POA: Diagnosis not present

## 2017-02-07 DIAGNOSIS — I10 Essential (primary) hypertension: Secondary | ICD-10-CM | POA: Insufficient documentation

## 2017-02-07 DIAGNOSIS — Z9049 Acquired absence of other specified parts of digestive tract: Secondary | ICD-10-CM | POA: Diagnosis not present

## 2017-02-07 DIAGNOSIS — R0602 Shortness of breath: Secondary | ICD-10-CM | POA: Insufficient documentation

## 2017-02-07 LAB — CBC
HCT: 44.2 % (ref 36.0–46.0)
Hemoglobin: 13.9 g/dL (ref 12.0–15.0)
MCH: 28.3 pg (ref 26.0–34.0)
MCHC: 31.4 g/dL (ref 30.0–36.0)
MCV: 89.8 fL (ref 78.0–100.0)
Platelets: 272 K/uL (ref 150–400)
RBC: 4.92 MIL/uL (ref 3.87–5.11)
RDW: 13.5 % (ref 11.5–15.5)
WBC: 7.3 K/uL (ref 4.0–10.5)

## 2017-02-07 LAB — BASIC METABOLIC PANEL WITH GFR
Anion gap: 6 (ref 5–15)
BUN: 13 mg/dL (ref 6–20)
CO2: 27 mmol/L (ref 22–32)
Calcium: 10.2 mg/dL (ref 8.9–10.3)
Chloride: 103 mmol/L (ref 101–111)
Creatinine, Ser: 0.73 mg/dL (ref 0.44–1.00)
GFR calc Af Amer: >60 mL/min
GFR calc non Af Amer: >60 mL/min
Glucose, Bld: 118 mg/dL — ABNORMAL HIGH (ref 65–99)
Potassium: 3.7 mmol/L (ref 3.5–5.1)
Sodium: 136 mmol/L (ref 135–145)

## 2017-02-07 LAB — I-STAT TROPONIN, ED
Troponin i, poc: 0 ng/mL (ref 0.00–0.08)
Troponin i, poc: 0 ng/mL (ref 0.00–0.08)

## 2017-02-07 NOTE — ED Provider Notes (Signed)
[name] EMERGENCY DEPARTMENT Provider Note   CSN: [id number] Arrival date & time: 02/07/17  1322     History   Chief Complaint Chief Complaint  Patient presents with  . Chest Pain    HPI April Lane is a 77 y.o. female.  HPI   The patient is a pleasant 77 year old female with a history of high blood pressure, history of diverticulitis but no other significant issues.  She takes her daily medications as she should including her blood pressure medications but notes that occasionally she feels short of breath when she leans on her right side, this goes away when she goes on her left side.  She has had no associated chest discomfort at all.  Today when she leaned over to pick something up she felt short of breath.  Occasionally she feels like she has palpitations but thinks that all of these things have resolved at this time.  No fevers chills nausea vomiting diarrhea abdominal pain back pain dysuria or swelling of the legs.  She has not seen her family doctor for this.  She has no history of pulmonary embolism cardiac disease or lung disease.  Past Medical History:  Diagnosis Date  . Arthritis   . Cataract    left eye immature  . Diverticulitis   . Fatigue   . GI bleed    many yrs ago  . History of blood transfusion    no abnormal reactions  . HTN (hypertension)    takes Amlodipine and Losartan daily  . Osteoporosis    steroid injection right knee a few yrs ago  . Peptic ulcer   . Vitamin D deficiency     Patient Active Problem List   Diagnosis Date Noted  . Rectal bleeding 05/09/2015  . Cholecystitis with cholelithiasis 01/14/2013  . Cholecystitis, acute 12/02/2012  . Leukocytosis, unspecified 12/02/2012  . Abdominal pain, other specified site 12/02/2012  . Nausea with vomiting 12/01/2012    Past Surgical History:  Procedure Laterality Date  . CHOLECYSTECTOMY  01/14/13  . CHOLECYSTECTOMY N/A 01/14/2013   Procedure: LAPAROSCOPIC  CHOLECYSTECTOMY WITH INTRAOPERATIVE CHOLANGIOGRAM;  Surgeon: Merrie Roof, MD;  Location: [facility];  Service: General;  Laterality: N/A;  . TUBAL LIGATION     at age 53    OB History    No data available       Home Medications    Prior to Admission medications   Medication Sig Start Date End Date Taking? Authorizing Provider  acetaminophen (TYLENOL) 500 MG tablet Take 500 mg by mouth See admin instructions. Take 1 tablet (500 mg) by mouth every morning, may also take 1 tablet (500 mg) at night as needed for pain   Yes [provider]  amLODipine (NORVASC) 5 MG tablet Take 5 mg by mouth daily.   Yes [provider]  aspirin EC 81 MG tablet Take 81 mg by mouth daily.   Yes [provider]  Bee Pollen 1000 MG TABS Take 1,000 mg by mouth 2 (two) times daily.   Yes [provider]  diclofenac sodium (VOLTAREN) 1 % GEL Apply 2 g topically 2 (two) times daily as needed (for pain/stiffness).    Yes [provider]  losartan (COZAAR) 100 MG tablet Take 100 mg by mouth daily.   Yes [provider]  meclizine (ANTIVERT) 25 MG tablet Take 25 mg by mouth 2 (two) times daily.  04/15/15  Yes [provider]  Multiple Vitamins-Minerals (ADULT ONE DAILY  GUMMIES) CHEW Chew 1 tablet by mouth daily.   Yes [provider]  aspirin 81 MG tablet Take 81 mg by mouth daily.    [provider]  Cholecalciferol (VITAMIN D3) 5000 UNITS TABS Take 1 tablet by mouth daily.    [provider]    Family History Family History  Problem Relation Age of Onset  . Other Mother 62       Died of old age  . Heart attack Father 62       Died of natural causes  . Prostate cancer Father     Social History Social History   Tobacco Use  . Smoking status: Never Smoker  . Smokeless tobacco: Never Used  Substance Use Topics  . Alcohol use: No  . Drug use: No     Allergies   Patient has no known allergies.   Review of  Systems Review of Systems  All other systems reviewed and are negative.    Physical Exam Updated Vital Signs BP 134/84   Pulse 75   Temp 97.8 F (36.6 C) (Oral)   Resp 12   SpO2 100%   Physical Exam  Constitutional: She appears well-developed and well-nourished. No distress.  HENT:  Head: Normocephalic and atraumatic.  Mouth/Throat: Oropharynx is clear and moist. No oropharyngeal exudate.  Eyes: Conjunctivae and EOM are normal. Pupils are equal, round, and reactive to light. Right eye exhibits no discharge. Left eye exhibits no discharge. No scleral icterus.  Neck: Normal range of motion. Neck supple. No JVD present. No thyromegaly present.  Cardiovascular: Normal rate, regular rhythm, normal heart sounds and intact distal pulses. Exam reveals no gallop and no friction rub.  No murmur heard. Pulmonary/Chest: Effort normal and breath sounds normal. No respiratory distress. She has no wheezes. She has no rales.  Abdominal: Soft. Bowel sounds are normal. She exhibits no distension and no mass. There is no tenderness.  Musculoskeletal: Normal range of motion. She exhibits no edema or tenderness.  Lymphadenopathy:    She has no cervical adenopathy.  Neurological: She is alert. Coordination normal.  Skin: Skin is warm and dry. No rash noted. No erythema.  Psychiatric: She has a normal mood and affect. Her behavior is normal.  Nursing note and vitals reviewed.    ED Treatments / Results  Labs (all labs ordered are listed, but only abnormal results are displayed) Labs Reviewed  BASIC METABOLIC PANEL - Abnormal; Notable for the following components:      Result Value   Glucose, Bld 118 (*)    All other components within normal limits  CBC  I-STAT TROPONIN, ED  I-STAT TROPONIN, ED    EKG  EKG Interpretation  Date/Time:  Tuesday February 07 2017 13:28:35 EST Ventricular Rate:  67 PR Interval:  186 QRS Duration: 130 QT Interval:  410 QTC Calculation: 433 R  Axis:   -36 Text Interpretation:  Sinus rhythm with Premature atrial complexes Left axis deviation Right bundle branch block Minimal voltage criteria for LVH, may be normal variant Abnormal ECG Since last tracing rate slower Confirmed by Noemi Chapel [phone]) on 02/07/2017 7:41:13 PM       Radiology Dg Chest 2 View  Result Date: 02/07/2017 CLINICAL DATA:  77 year old female with a history of chest pain and shortness of breath for a week EXAM: CHEST  2 VIEW COMPARISON:  01/09/2013 FINDINGS: Cardiomediastinal silhouette unchanged in size and contour. No evidence of central vascular congestion. No pneumothorax or pleural effusion. No confluent airspace disease. IMPRESSION:  No radiographic evidence of acute cardiopulmonary disease Electronically Signed   By: Corrie Mckusick D.O.   On: 02/07/2017 13:57    Procedures Procedures (including critical care time)  Medications Ordered in ED Medications - No data to display   Initial Impression / Assessment and Plan / ED Course  I have reviewed the triage vital signs and the nursing notes.  Pertinent labs & imaging results that were available during my care of the patient were reviewed by me and considered in my medical decision making (see chart for details).     The patient's chest x-ray is unremarkable, there is no abnormalities, her EKG was fine, her labs have been unremarkable as well and the patient is very well-appearing with no symptoms.  I am not sure the exact etiology of what is going on but she does state that she is in the process of having her thyroid worked up because of abnormal calcium levels.  At this time I feel the patient is stable to go home and can follow-up in the outpatient setting.  She is aware of the indications for return    Final Clinical Impressions(s) / ED Diagnoses   Final diagnoses:  Shortness of breath    ED Discharge Orders    None       Noemi Chapel, MD 02/07/17 2136

## 2017-02-07 NOTE — Discharge Instructions (Addendum)
Your testing today revealed no significant abnormalities, all of the tests look good, there is no signs of pneumonia  Return to the emergency department immediately for severe or worsening pain, difficulty breathing or fevers.  You may want to have your family doctor follow-up with you for a Holter monitor test if you continue to feel intermittent palpitations

## 2017-02-07 NOTE — ED Triage Notes (Signed)
Pt presents for evaluation of chest pain x 1 week with exertion. Reports she feels like her heart starts beating harder and pain resolves with resting. Pt denies hx of same.

## 2017-03-14 IMAGING — CT CT ABD-PELV W/ CM
2 of 5 series · 16 of 46 positions shown, 18 images · IV contrast (Omni 300)
Comparison: 12/01/2012

CLINICAL DATA: Rectal bleeding, history hypertension

EXAM:
CT ABDOMEN AND PELVIS WITH CONTRAST
TECHNIQUE: Multidetector CT imaging of the abdomen and pelvis was performed
using the standard protocol following bolus administration of
intravenous contrast. Sagittal and coronal MPR images reconstructed
from axial data set.
CONTRAST:  100 cc Isovue 300 IV. Dilute oral contrast was not
administered.

[Series 2: a/p w/ 5mm · axial · 0.86mm/px · z∈[-420,+5]mm · 13 of 96 slices shown, 15 images]
[im 6/96  soft-tissue]
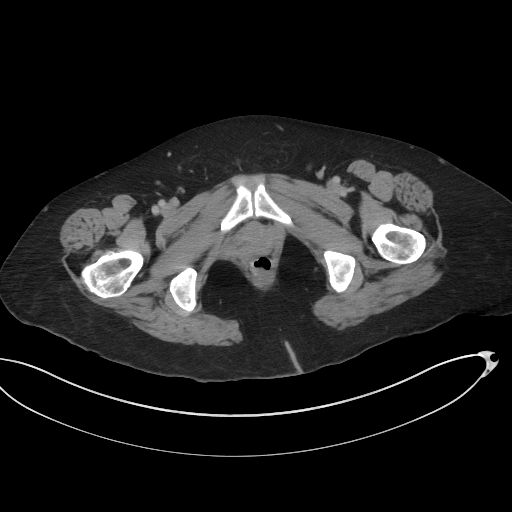
[im 6/96  bone]
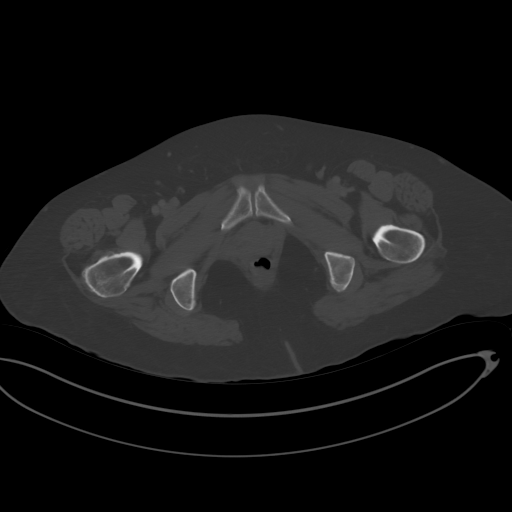
[im 16/96  soft-tissue]
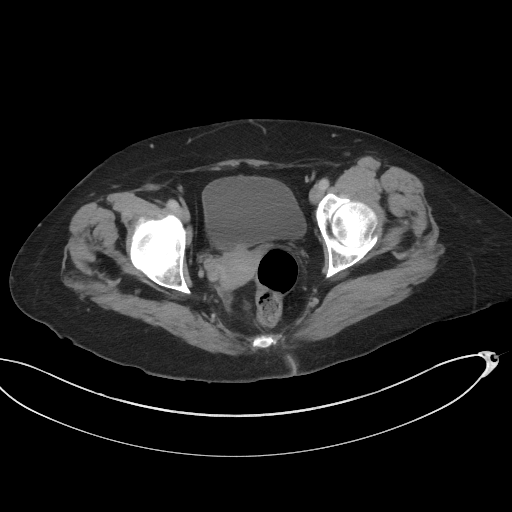
[im 21/96  soft-tissue]
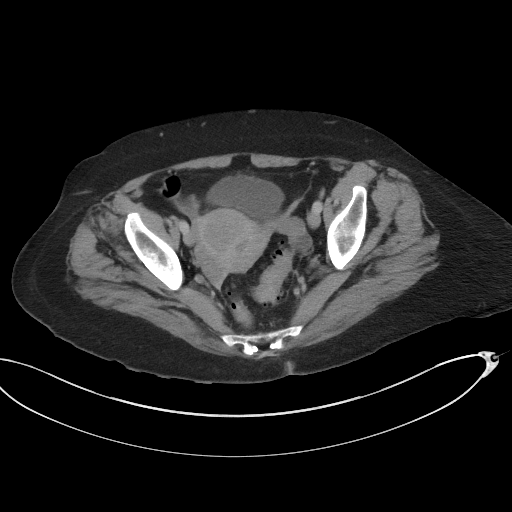
[im 26/96  soft-tissue]
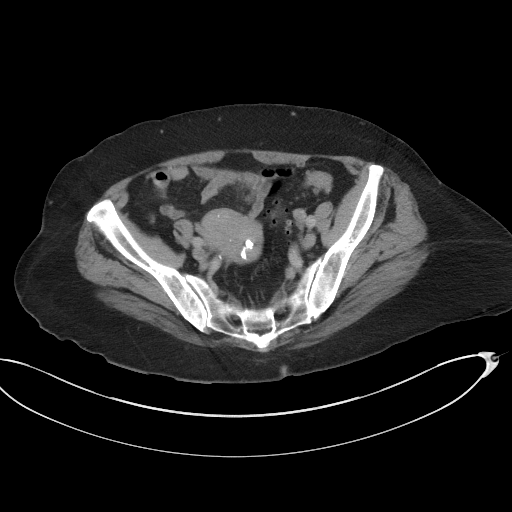
[im 36/96  soft-tissue]
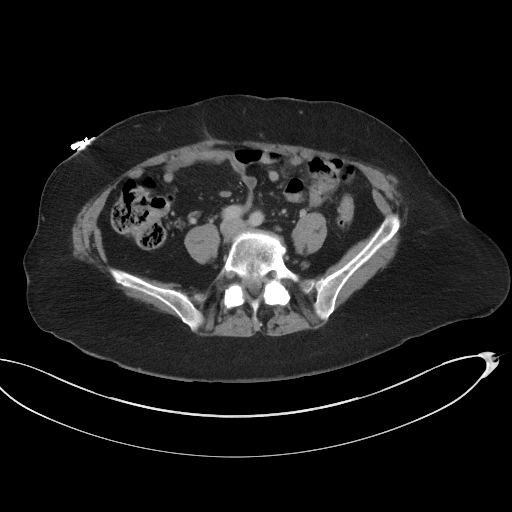
[im 41/96  soft-tissue]
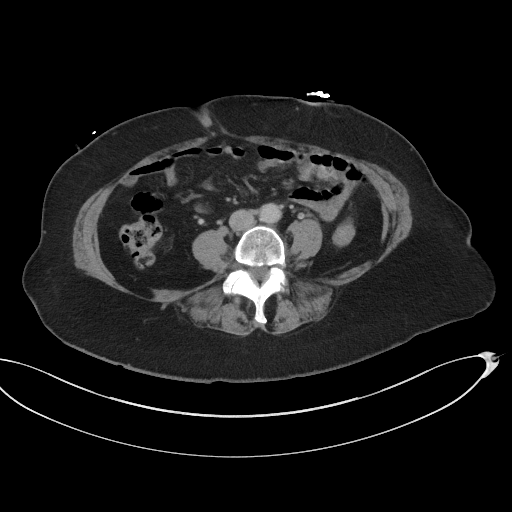
[im 51/96  soft-tissue]
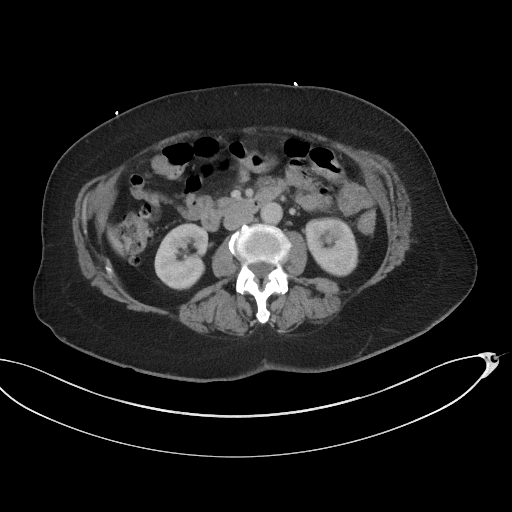
[im 56/96  soft-tissue]
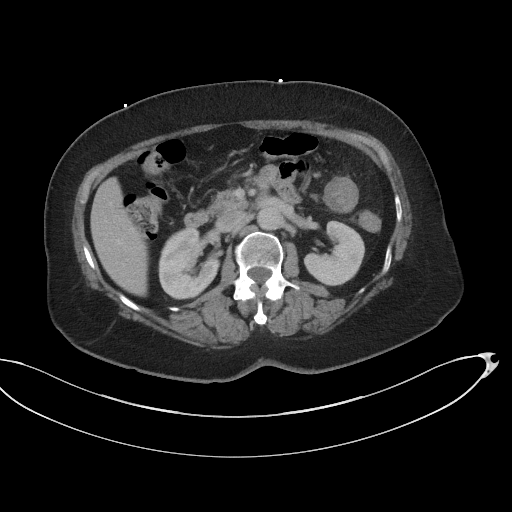
[im 61/96  soft-tissue]
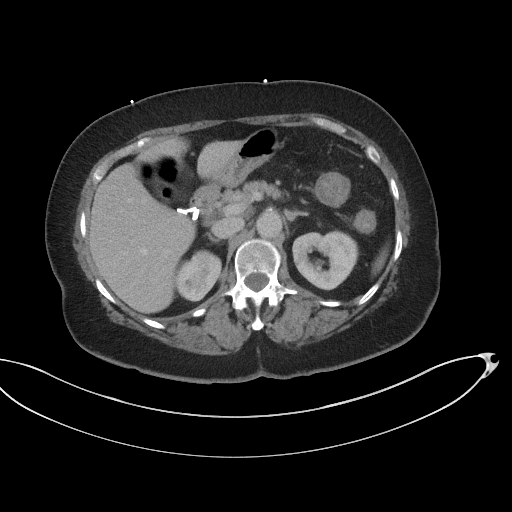
[im 61/96  bone]
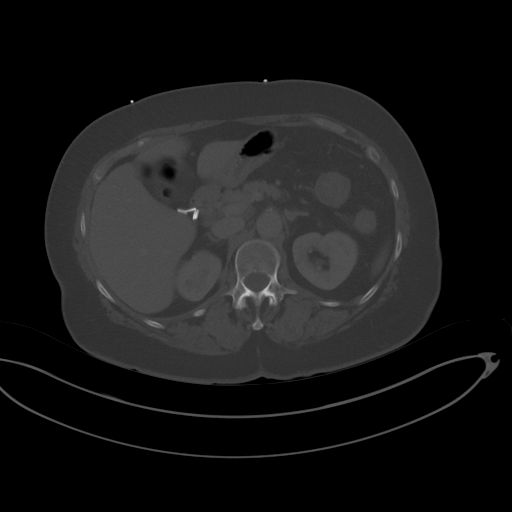
[im 71/96  soft-tissue]
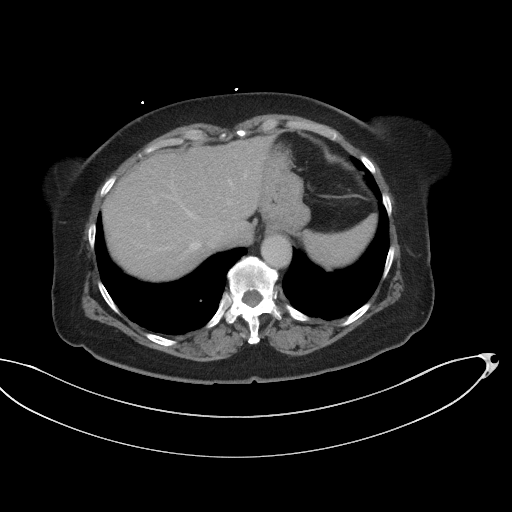
[im 76/96  soft-tissue]
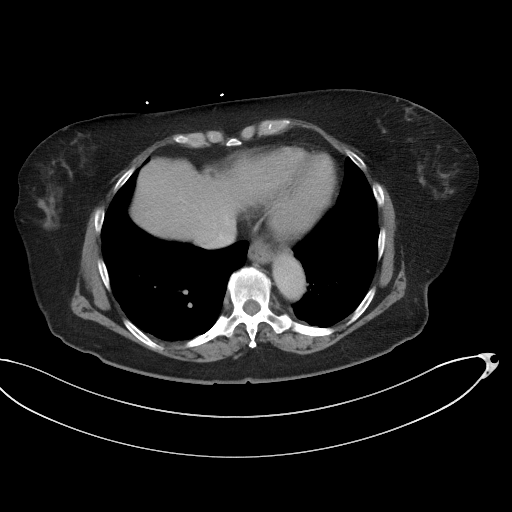
[im 81/96  soft-tissue]
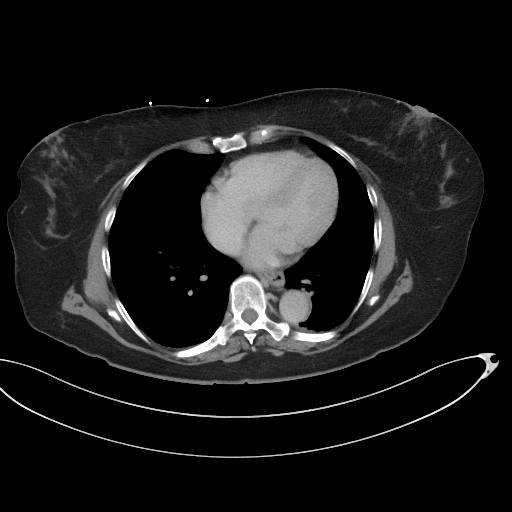
[im 91/96  soft-tissue]
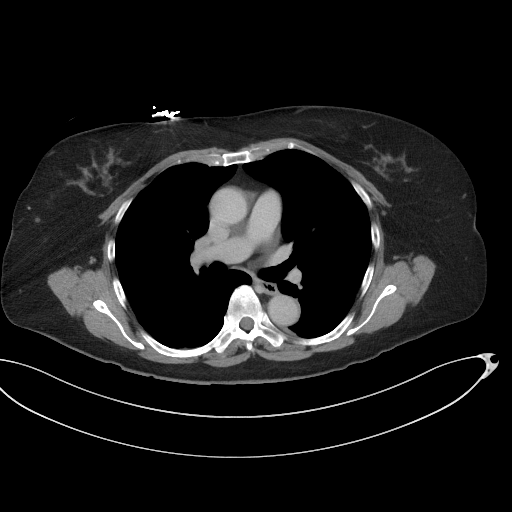

[Series 5: a/p w/ cor · coronal · 0.77mm/px · 3 of 133 slices shown]
[im 45/133  soft-tissue]
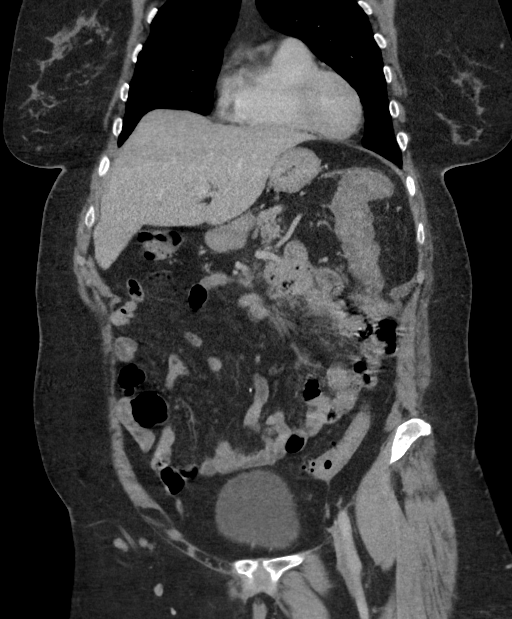
[im 59/133  soft-tissue]
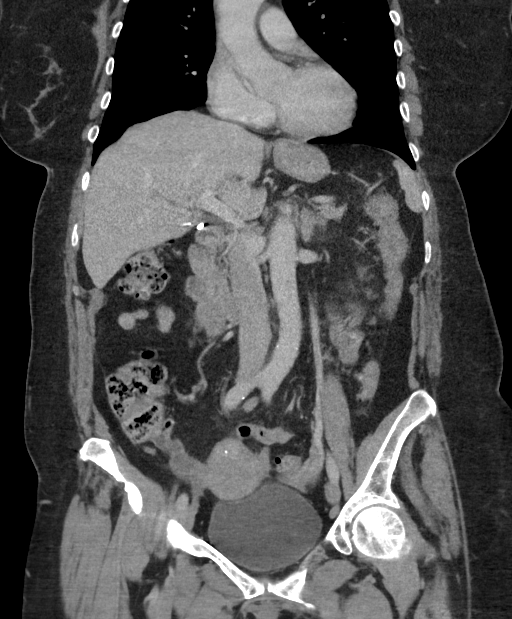
[im 74/133  soft-tissue]
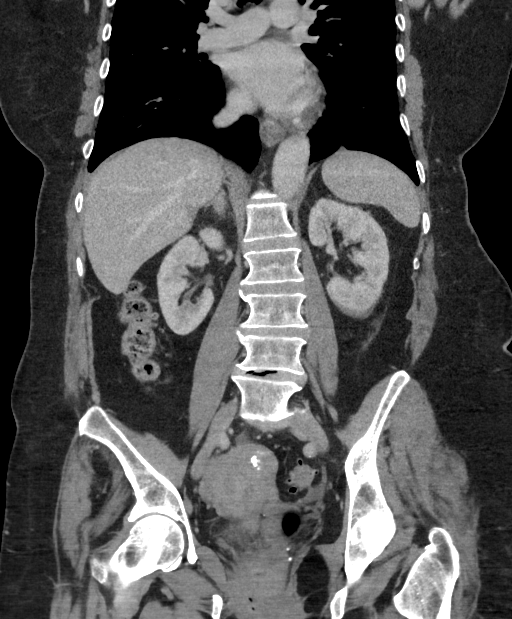

[16 of 46 positions shown; findings below may reference images not displayed]

FINDINGS: Minimal dependent atelectasis at lung bases.

Gallbladder surgically absent.

Tiny probable BILATERAL renal cysts.

Liver, spleen, pancreas, kidneys, and adrenal glands otherwise
normal appearance.

Normal appendix.

Bowel all day above the distal transverse through proximal
descending colon compatible with colitis.

Diverticulosis of descending and sigmoid colon without significant
wall thickening.

Stomach and bowel loops otherwise normal appearance for exam lacking
GI contrast.

Small amount of nonspecific free pelvic fluid.

Tiny LEFT ovarian cyst 16 x 13 mm.

Bladder, ureters, at RIGHT ovary unremarkable.

Uterine leiomyomata largest 3.9 x 3.9 cm.

Minimal atherosclerotic calcification.

No mass, adenopathy, free air, hernia, or acute osseous findings.
IMPRESSION: Distal colonic diverticulosis without evidence of diverticulitis.

Wall thickening of the distal transverse through proximal sigmoid
colon, centered at splenic flexure, consistent with colitis ;
differential diagnosis includes infection, inflammatory bowel
disease, and ischemia.

Small amount of nonspecific free pelvic fluid.

Uterine leiomyomata.

## 2018-12-14 IMAGING — DX DG CHEST 2V
2 series · 2 of 2 positions shown · non-contrast
Comparison: 01/09/2013

CLINICAL DATA: 76-year-old female with a history of chest pain and
shortness of breath for a week

EXAM:
CHEST  2 VIEW

[chest pa]
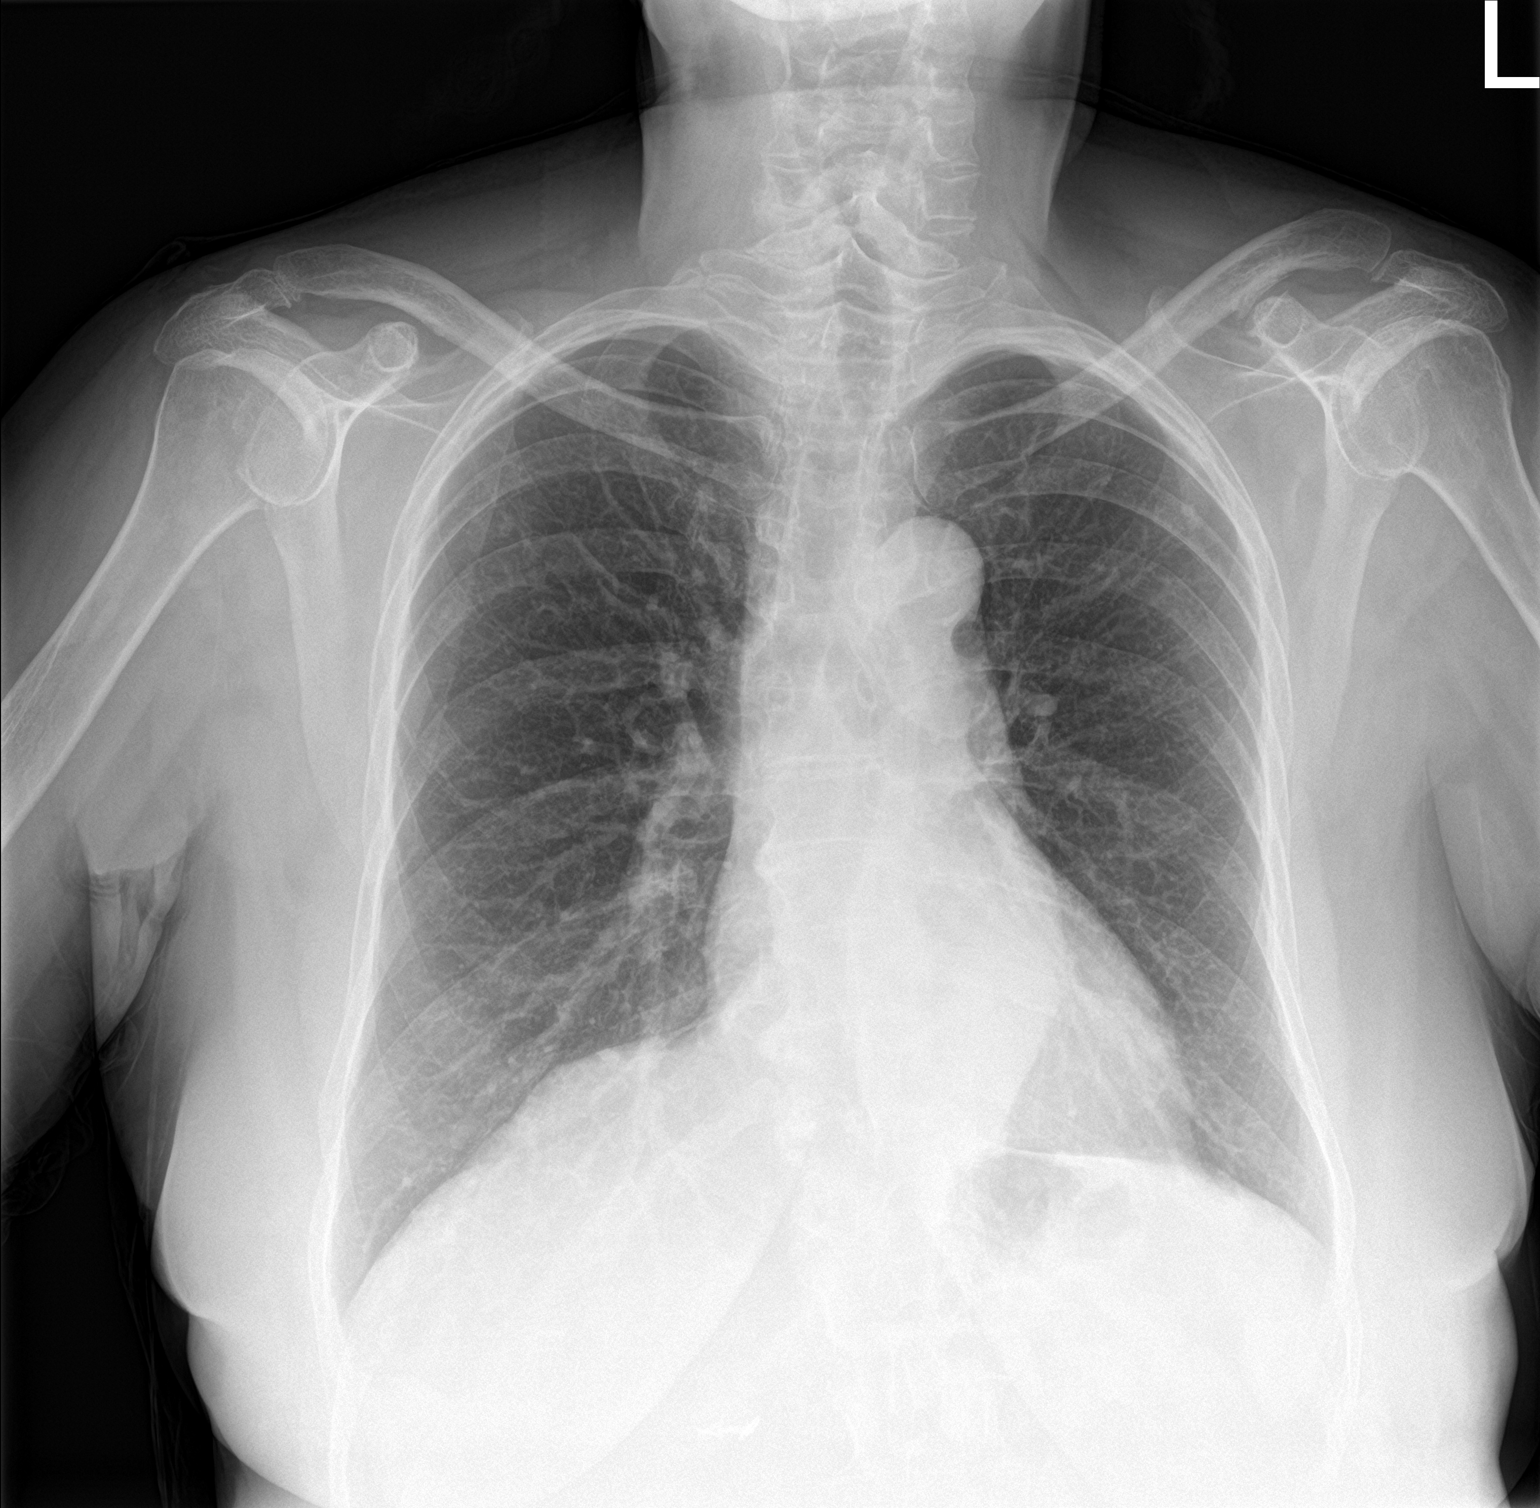

[chest lat]
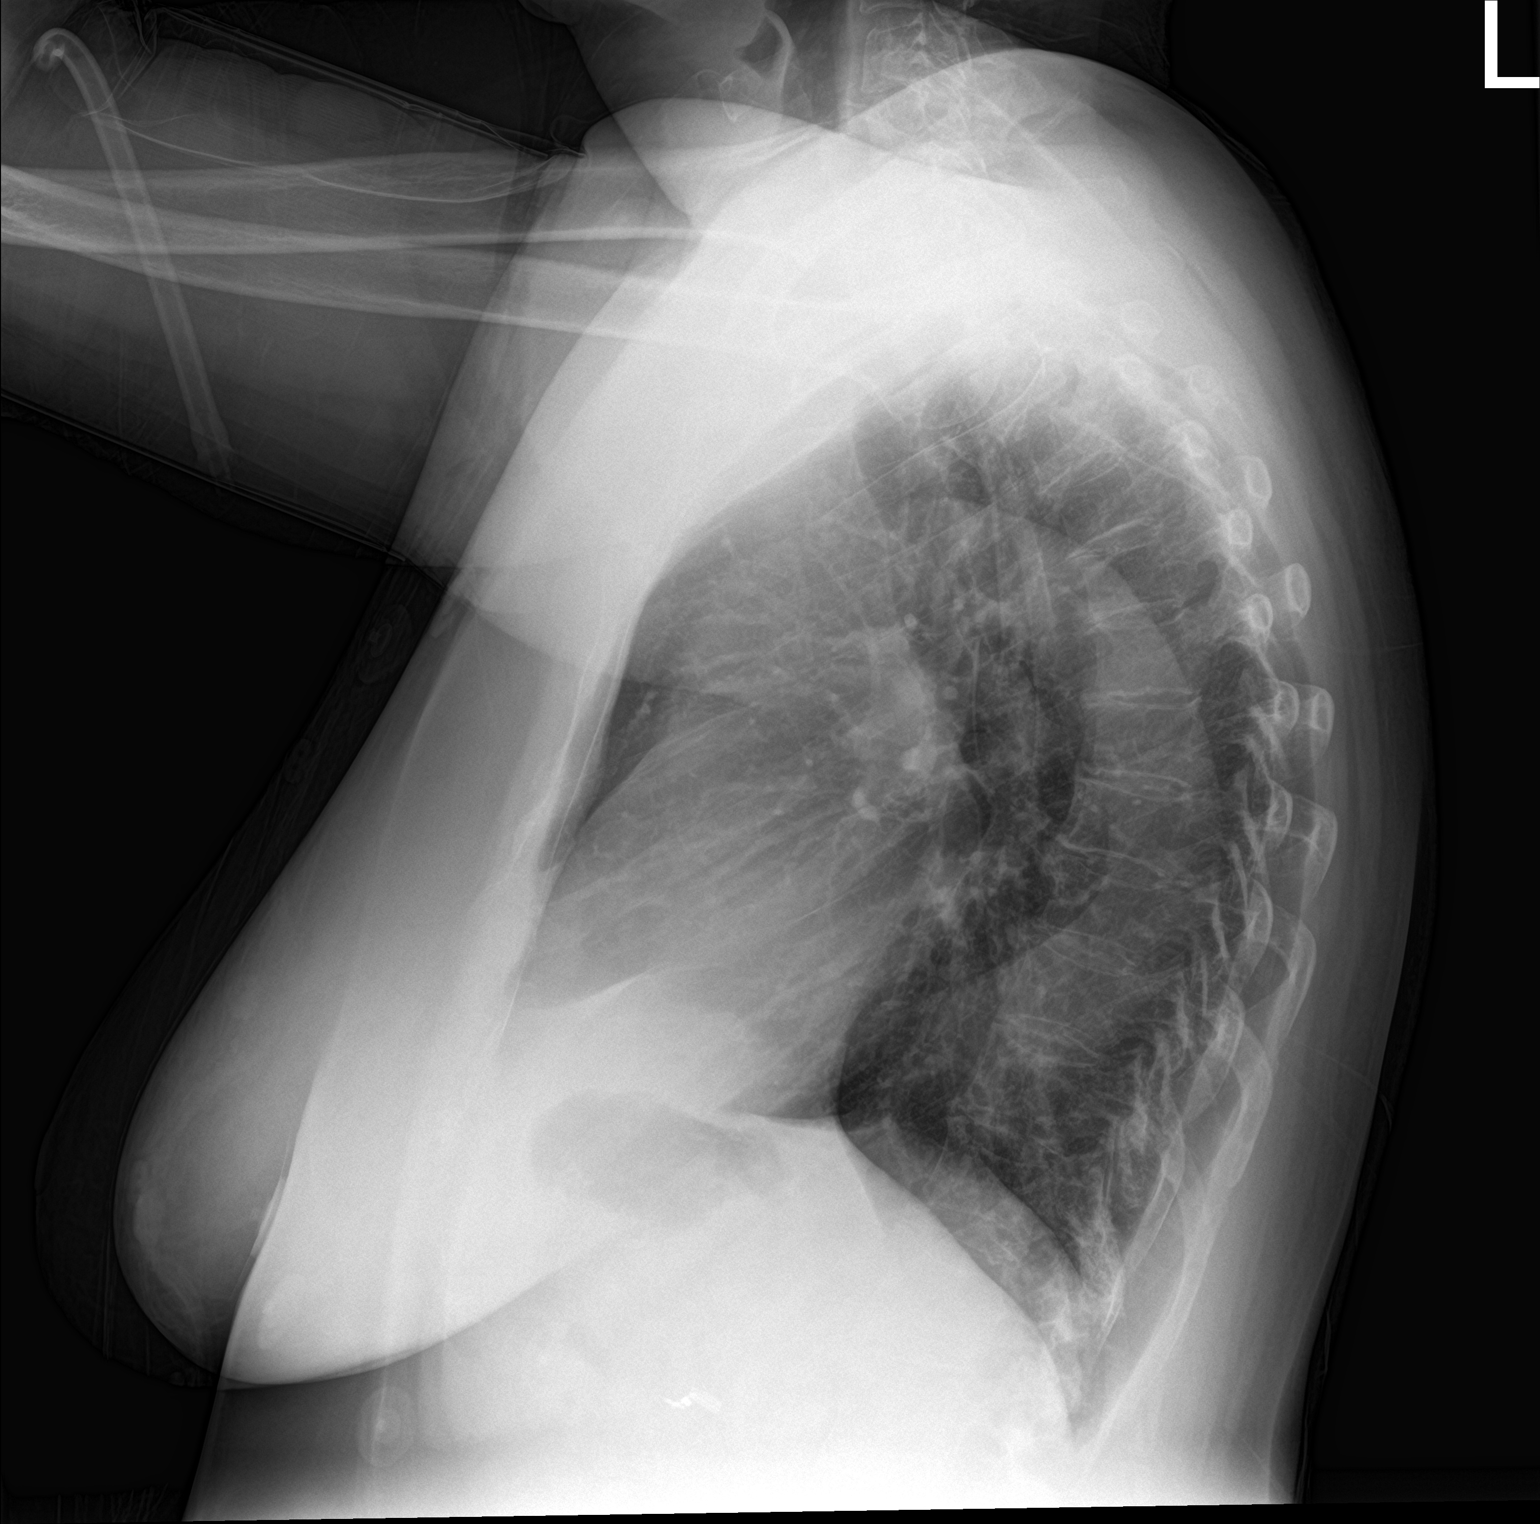

[2 of 2 positions shown; findings below may reference images not displayed]

FINDINGS: Cardiomediastinal silhouette unchanged in size and contour. No
evidence of central vascular congestion. No pneumothorax or pleural
effusion. No confluent airspace disease.
IMPRESSION: No radiographic evidence of acute cardiopulmonary disease

## 2019-04-06 ENCOUNTER — Ambulatory Visit: Payer: Medicare HMO | Attending: Internal Medicine

## 2019-04-06 DIAGNOSIS — Z23 Encounter for immunization: Secondary | ICD-10-CM | POA: Insufficient documentation

## 2019-04-06 NOTE — Progress Notes (Signed)
   Covid-19 Vaccination Clinic  Name:  April Lane    MRN: [id number] DOB: Sep 13, 1940  04/06/2019  [name] was observed post Covid-19 immunization for 15 minutes without incidence. She was provided with Vaccine Information Sheet and instruction to access the V-Safe system.   [name] was instructed to call 911 with any severe reactions post vaccine: Marland Kitchen Difficulty breathing  . Swelling of your face and throat  . A fast heartbeat  . A bad rash all over your body  . Dizziness and weakness    Immunizations Administered    Name Date Dose VIS Date Route   Pfizer COVID-19 Vaccine 04/06/2019  9:32 AM 0.3 mL 01/18/2019 Intramuscular   Manufacturer: [place]   Lot: [id number]   [place]: [id number]

## 2019-04-07 ENCOUNTER — Ambulatory Visit: Payer: Medicare Other

## 2019-05-01 ENCOUNTER — Ambulatory Visit: Payer: Medicare HMO | Attending: Internal Medicine

## 2019-05-01 DIAGNOSIS — Z23 Encounter for immunization: Secondary | ICD-10-CM

## 2019-05-01 NOTE — Progress Notes (Signed)
   Covid-19 Vaccination Clinic  Name:  HIBBA STARON    MRN: [id number] DOB: [birth year]  05/01/2019  Ms. Avalos was observed post Covid-19 immunization for 15 minutes without incident. She was provided with Vaccine Information Sheet and instruction to access the V-Safe system.   Ms. Thien was instructed to call 911 with any severe reactions post vaccine: Marland Kitchen Difficulty breathing  . Swelling of face and throat  . A fast heartbeat  . A bad rash all over body  . Dizziness and weakness   Immunizations Administered    Name Date Dose VIS Date Route   Pfizer COVID-19 Vaccine 05/01/2019  3:47 PM 0.3 mL 01/18/2019 Intramuscular   Manufacturer: [place]   Lot: [id number]   [place]: [id number]

## 2020-11-04 ENCOUNTER — Ambulatory Visit (INDEPENDENT_AMBULATORY_CARE_PROVIDER_SITE_OTHER): Payer: Medicare HMO | Admitting: Otolaryngology

## 2020-11-18 ENCOUNTER — Ambulatory Visit (INDEPENDENT_AMBULATORY_CARE_PROVIDER_SITE_OTHER): Payer: Medicare HMO | Admitting: Otolaryngology

## 2020-11-18 ENCOUNTER — Other Ambulatory Visit: Payer: Self-pay

## 2020-11-18 DIAGNOSIS — H6123 Impacted cerumen, bilateral: Secondary | ICD-10-CM | POA: Diagnosis not present

## 2020-11-18 DIAGNOSIS — J310 Chronic rhinitis: Secondary | ICD-10-CM | POA: Diagnosis not present

## 2020-11-18 NOTE — Progress Notes (Signed)
HPI: April Lane is a 80 y.o. female who returns today for evaluation of wax buildup in her ears.  She was last cleaned in 2017.  She also complains of slight sore throat and "sinus" drainage..  Past Medical History:  Diagnosis Date   Arthritis    Cataract    left eye immature   Diverticulitis    Fatigue    GI bleed    many yrs ago   History of blood transfusion    no abnormal reactions   HTN (hypertension)    takes Amlodipine and Losartan daily   Osteoporosis    steroid injection right knee a few yrs ago   Peptic ulcer    Vitamin D deficiency    Past Surgical History:  Procedure Laterality Date   CHOLECYSTECTOMY  01/14/13   CHOLECYSTECTOMY N/A 01/14/2013   Procedure: LAPAROSCOPIC CHOLECYSTECTOMY WITH INTRAOPERATIVE CHOLANGIOGRAM;  Surgeon: Merrie Roof, MD;  Location: [facility] OR;  Service: General;  Laterality: N/A;   TUBAL LIGATION     at age 29   Social History   Socioeconomic History   Marital status: Single    Spouse name: Not on file   Number of children: Not on file   Years of education: Not on file   Highest education level: Not on file  Occupational History   Occupation: CNA    Employer: OTHER  Tobacco Use   Smoking status: Never   Smokeless tobacco: Never  Substance and Sexual Activity   Alcohol use: No   Drug use: No   Sexual activity: Not on file  Other Topics Concern   Not on file  Social History Narrative   Not on file   Social Determinants of Health   Financial Resource Strain: Not on file  Food Insecurity: Not on file  Transportation Needs: Not on file  Physical Activity: Not on file  Stress: Not on file  Social Connections: Not on file   Family History  Problem Relation Age of Onset   Other Mother 67       Died of old age   Heart attack Father 66       Died of natural causes   Prostate cancer Father    No Known Allergies Prior to Admission medications   Medication Sig Start Date End Date Taking? Authorizing Provider   acetaminophen (TYLENOL) 500 MG tablet Take 500 mg by mouth See admin instructions. Take 1 tablet (500 mg) by mouth every morning, may also take 1 tablet (500 mg) at night as needed for pain    [provider]  amLODipine (NORVASC) 5 MG tablet Take 5 mg by mouth daily.    [provider]  aspirin EC 81 MG tablet Take 81 mg by mouth daily.    [provider]  Bee Pollen 1000 MG TABS Take 1,000 mg by mouth 2 (two) times daily.    [provider]  diclofenac sodium (VOLTAREN) 1 % GEL Apply 2 g topically 2 (two) times daily as needed (for pain/stiffness).     [provider]  losartan (COZAAR) 100 MG tablet Take 100 mg by mouth daily.    [provider]  meclizine (ANTIVERT) 25 MG tablet Take 25 mg by mouth 2 (two) times daily.  04/15/15   [provider]  Multiple Vitamins-Minerals (ADULT ONE DAILY GUMMIES) CHEW Chew 1 tablet by mouth daily.    [provider]  simvastatin (ZOCOR) 20 MG tablet Take 20 mg by mouth at bedtime. 12/23/16  [provider]     Positive ROS: Otherwise negative  All other systems have been reviewed and were otherwise negative with the exception of those mentioned in the HPI and as above.  Physical Exam: Constitutional: Alert, well-appearing, no acute distress Ears: External ears without lesions or tenderness. Ear canals are both completely occluded with cerumen that was removed with curette and suction.  After cleaning the ear canals the TMs were clear otherwise. Nasal: External nose without lesions. Septum with minimal deformity and mild rhinitis.  Both middle meatus regions are clear with no mucopurulent discharge and only clear mucus within the nasal cavity..  Oral: Lips and gums without lesions. Tongue and palate mucosa without lesions. Posterior oropharynx clear.  Tonsil regions are benign in appearance bilaterally with slight dry throat but normal oral mucosa. Neck: No palpable  adenopathy or masses Respiratory: Breathing comfortably  Skin: No facial/neck lesions or rash noted.  Cerumen impaction removal  Date/Time: 11/18/2020 12:04 PM Performed by: Rozetta Nunnery, MD Authorized by: Rozetta Nunnery, MD   Consent:    Consent obtained:  Verbal   Consent given by:  Patient   Risks discussed:  Pain and bleeding Procedure details:    Location:  L ear and R ear   Procedure type: curette and forceps   Post-procedure details:    Inspection:  TM intact and canal normal   Hearing quality:  Improved   Procedure completion:  Tolerated well, no immediate complications Comments:     Both TMs are occluded with cerumen that was cleaned in the office using curette and suction.  TMs were clear otherwise.  Assessment: Bilateral cerumen impactions Rhinitis  Plan: For the postnasal drainage recommended use of Flonase and saline rinses. Ear canals were cleaned in the office today with clear TMs bilaterally.   Radene Journey, MD

## 2021-11-13 ENCOUNTER — Encounter (HOSPITAL_BASED_OUTPATIENT_CLINIC_OR_DEPARTMENT_OTHER): Payer: Self-pay | Admitting: Emergency Medicine

## 2021-11-13 ENCOUNTER — Other Ambulatory Visit: Payer: Self-pay

## 2021-11-13 DIAGNOSIS — Z7982 Long term (current) use of aspirin: Secondary | ICD-10-CM | POA: Diagnosis not present

## 2021-11-13 DIAGNOSIS — Z79899 Other long term (current) drug therapy: Secondary | ICD-10-CM | POA: Diagnosis not present

## 2021-11-13 DIAGNOSIS — N950 Postmenopausal bleeding: Secondary | ICD-10-CM | POA: Diagnosis not present

## 2021-11-13 DIAGNOSIS — I10 Essential (primary) hypertension: Secondary | ICD-10-CM | POA: Insufficient documentation

## 2021-11-13 DIAGNOSIS — N841 Polyp of cervix uteri: Secondary | ICD-10-CM | POA: Insufficient documentation

## 2021-11-13 DIAGNOSIS — N939 Abnormal uterine and vaginal bleeding, unspecified: Secondary | ICD-10-CM | POA: Diagnosis present

## 2021-11-13 LAB — CBC WITH DIFFERENTIAL/PLATELET
Abs Immature Granulocytes: 0.02 10*3/uL (ref 0.00–0.07)
Basophils Absolute: 0 10*3/uL (ref 0.0–0.1)
Basophils Relative: 1 %
Eosinophils Absolute: 0.1 10*3/uL (ref 0.0–0.5)
Eosinophils Relative: 1 %
HCT: 48.2 % — ABNORMAL HIGH (ref 36.0–46.0)
Hemoglobin: 15.2 g/dL — ABNORMAL HIGH (ref 12.0–15.0)
Immature Granulocytes: 0 %
Lymphocytes Relative: 21 %
Lymphs Abs: 1.8 10*3/uL (ref 0.7–4.0)
MCH: 27.3 pg (ref 26.0–34.0)
MCHC: 31.5 g/dL (ref 30.0–36.0)
MCV: 86.5 fL (ref 80.0–100.0)
Monocytes Absolute: 0.5 10*3/uL (ref 0.1–1.0)
Monocytes Relative: 6 %
Neutro Abs: 6.2 10*3/uL (ref 1.7–7.7)
Neutrophils Relative %: 71 %
Platelets: 276 10*3/uL (ref 150–400)
RBC: 5.57 MIL/uL — ABNORMAL HIGH (ref 3.87–5.11)
RDW: 13.8 % (ref 11.5–15.5)
WBC: 8.7 10*3/uL (ref 4.0–10.5)
nRBC: 0 % (ref 0.0–0.2)

## 2021-11-13 LAB — BASIC METABOLIC PANEL
Anion gap: 7 (ref 5–15)
BUN: 15 mg/dL (ref 8–23)
CO2: 25 mmol/L (ref 22–32)
Calcium: 10.2 mg/dL (ref 8.9–10.3)
Chloride: 105 mmol/L (ref 98–111)
Creatinine, Ser: 0.85 mg/dL (ref 0.44–1.00)
GFR, Estimated: >60 mL/min (ref >60)
Glucose, Bld: 114 mg/dL — ABNORMAL HIGH (ref 70–99)
Potassium: 3.9 mmol/L (ref 3.5–5.1)
Sodium: 137 mmol/L (ref 135–145)

## 2021-11-13 NOTE — ED Triage Notes (Addendum)
Pt presents to ED Pov. Pt c/o vaginal bleeding that began today. Reports that large clots and bright red blood. Denies weakness, denies any blood thinners.

## 2021-11-14 ENCOUNTER — Emergency Department (HOSPITAL_BASED_OUTPATIENT_CLINIC_OR_DEPARTMENT_OTHER)
Admission: EM | Admit: 2021-11-14 | Discharge: 2021-11-14 | Disposition: A | Discharge status: Home / Self Care | Payer: Medicare HMO | Attending: Emergency Medicine | Admitting: Emergency Medicine

## 2021-11-14 DIAGNOSIS — N950 Postmenopausal bleeding: Secondary | ICD-10-CM

## 2021-11-14 DIAGNOSIS — N841 Polyp of cervix uteri: Secondary | ICD-10-CM

## 2021-11-14 LAB — URINALYSIS, ROUTINE W REFLEX MICROSCOPIC
Bilirubin Urine: NEGATIVE
Glucose, UA: NEGATIVE mg/dL
Ketones, ur: NEGATIVE mg/dL
Nitrite: NEGATIVE
Protein, ur: NEGATIVE mg/dL
Specific Gravity, Urine: 1.01 (ref 1.005–1.030)
pH: 6.5 (ref 5.0–8.0)

## 2021-11-14 NOTE — ED Notes (Signed)
Pt. Refusing to return to this facility on this day for scheduled ultrasound. States the earliest she will come back is 10/9

## 2021-11-14 NOTE — ED Provider Notes (Signed)
[facility] Provider Note: Georgena Spurling, MD, FACEP  CSN: [id number] MRN: [id number] ARRIVAL: 11/13/21 at 2104 ROOM: DB011/DB011   CHIEF COMPLAINT  Vaginal Bleeding   HISTORY OF PRESENT ILLNESS  11/14/21 2:28 AM April Lane is a 81 y.o. female with vaginal bleeding that began yesterday.  She was passing both bright red blood and clots.  She denies any weakness.  She denies any pain.  She is not on anticoagulation.  Bleeding has subsequently lessened.   Past Medical History:  Diagnosis Date   Arthritis    Cataract    left eye immature   Diverticulitis    Fatigue    GI bleed    many yrs ago   History of blood transfusion    no abnormal reactions   HTN (hypertension)    takes Amlodipine and Losartan daily   Osteoporosis    steroid injection right knee a few yrs ago   Peptic ulcer    Vitamin D deficiency     Past Surgical History:  Procedure Laterality Date   CHOLECYSTECTOMY  01/14/13   CHOLECYSTECTOMY N/A 01/14/2013   Procedure: LAPAROSCOPIC CHOLECYSTECTOMY WITH INTRAOPERATIVE CHOLANGIOGRAM;  Surgeon: Merrie Roof, MD;  Location: [facility];  Service: General;  Laterality: N/A;   TUBAL LIGATION     at age 56    Family History  Problem Relation Age of Onset   Other Mother 58       Died of old age   Heart attack Father 28       Died of natural causes   Prostate cancer Father     Social History   Tobacco Use   Smoking status: Never   Smokeless tobacco: Never  Substance Use Topics   Alcohol use: No   Drug use: No    Prior to Admission medications   Medication Sig Start Date End Date Taking? Authorizing Provider  acetaminophen (TYLENOL) 500 MG tablet Take 500 mg by mouth See admin instructions. Take 1 tablet (500 mg) by mouth every morning, may also take 1 tablet (500 mg) at night as needed for pain    [provider]  amLODipine (NORVASC) 5 MG tablet Take 5 mg by mouth daily.    [provider]  aspirin EC 81 MG tablet Take 81 mg  by mouth daily.    [provider]  Bee Pollen 1000 MG TABS Take 1,000 mg by mouth 2 (two) times daily.    [provider]  diclofenac sodium (VOLTAREN) 1 % GEL Apply 2 g topically 2 (two) times daily as needed (for pain/stiffness).     [provider]  losartan (COZAAR) 100 MG tablet Take 100 mg by mouth daily.    [provider]  meclizine (ANTIVERT) 25 MG tablet Take 25 mg by mouth 2 (two) times daily.  04/15/15   [provider]  Multiple Vitamins-Minerals (ADULT ONE DAILY GUMMIES) CHEW Chew 1 tablet by mouth daily.    [provider]  simvastatin (ZOCOR) 20 MG tablet Take 20 mg by mouth at bedtime. 12/23/16   [provider]    Allergies Patient has no known allergies.   REVIEW OF SYSTEMS  Negative except as noted here or in the History of Present Illness.   PHYSICAL EXAMINATION  Initial Vital Signs Blood pressure (!) 119/91, pulse 75, temperature 98.6 F (37 C), temperature source Oral, resp. rate 18, SpO2 98 %.  Examination General: Well-developed, well-nourished female in no acute distress; appearance consistent with age  of record HENT: normocephalic; atraumatic Eyes: Normal appearance Neck: supple Heart: regular rate and rhythm Lungs: clear to auscultation bilaterally Abdomen: soft; nondistended; nontender; bowel sounds present GU: Normal external genitalia for postmenopausal woman; small amount of bleeding in vagina and in cervical os; small, smooth, polypoid lesion protruding through cervical os with mild tenderness; mildly enlarged nontender uterus Extremities: No deformity; full range of motion; pulses normal Neurologic: Awake, alert and oriented; motor function intact in all extremities and symmetric; no facial droop Skin: Warm and dry Psychiatric: Normal mood and affect   RESULTS  Summary of this visit's results, reviewed and interpreted by myself:   EKG Interpretation  Date/Time:    Ventricular  Rate:    PR Interval:    QRS Duration:   QT Interval:    QTC Calculation:   R Axis:     Text Interpretation:         Laboratory Studies: Results for orders placed or performed during the hospital encounter of 11/14/21 (from the past 24 hour(s))  CBC with Differential     Status: Abnormal   Collection Time: 11/13/21  9:17 PM  Result Value Ref Range   WBC 8.7 4.0 - 10.5 K/uL   RBC 5.57 (H) 3.87 - 5.11 MIL/uL   Hemoglobin 15.2 (H) 12.0 - 15.0 g/dL   HCT 48.2 (H) 36.0 - 46.0 %   MCV 86.5 80.0 - 100.0 fL   MCH 27.3 26.0 - 34.0 pg   MCHC 31.5 30.0 - 36.0 g/dL   RDW 13.8 11.5 - 15.5 %   Platelets 276 150 - 400 K/uL   nRBC 0.0 0.0 - 0.2 %   Neutrophils Relative % 71 %   Neutro Abs 6.2 1.7 - 7.7 K/uL   Lymphocytes Relative 21 %   Lymphs Abs 1.8 0.7 - 4.0 K/uL   Monocytes Relative 6 %   Monocytes Absolute 0.5 0.1 - 1.0 K/uL   Eosinophils Relative 1 %   Eosinophils Absolute 0.1 0.0 - 0.5 K/uL   Basophils Relative 1 %   Basophils Absolute 0.0 0.0 - 0.1 K/uL   Immature Granulocytes 0 %   Abs Immature Granulocytes 0.02 0.00 - 0.07 K/uL  Basic metabolic panel     Status: Abnormal   Collection Time: 11/13/21  9:17 PM  Result Value Ref Range   Sodium 137 135 - 145 mmol/L   Potassium 3.9 3.5 - 5.1 mmol/L   Chloride 105 98 - 111 mmol/L   CO2 25 22 - 32 mmol/L   Glucose, Bld 114 (H) 70 - 99 mg/dL   BUN 15 8 - 23 mg/dL   Creatinine, Ser 0.85 0.44 - 1.00 mg/dL   Calcium 10.2 8.9 - 10.3 mg/dL   GFR, Estimated >60 >60 mL/min   Anion gap 7 5 - 15  Urinalysis, Routine w reflex microscopic     Status: Abnormal   Collection Time: 11/14/21  2:43 AM  Result Value Ref Range   Color, Urine COLORLESS (A) YELLOW   APPearance CLEAR CLEAR   Specific Gravity, Urine 1.010 1.005 - 1.030   pH 6.5 5.0 - 8.0   Glucose, UA NEGATIVE NEGATIVE mg/dL   Hgb urine dipstick LARGE (A) NEGATIVE   Bilirubin Urine NEGATIVE NEGATIVE   Ketones, ur NEGATIVE NEGATIVE mg/dL   Protein, ur NEGATIVE NEGATIVE mg/dL    Nitrite NEGATIVE NEGATIVE   Leukocytes,Ua TRACE (A) NEGATIVE   RBC / HPF 21-50 0 - 5 RBC/hpf   WBC, UA 0-5 0 - 5 WBC/hpf   Bacteria,  UA RARE (A) NONE SEEN   Squamous Epithelial / LPF 0-5 0 - 5   Mucus PRESENT    Imaging Studies: No results found.  ED COURSE and MDM  Nursing notes, initial and subsequent vitals signs, including pulse oximetry, reviewed and interpreted by myself.  Vitals:   11/13/21 2112 11/14/21 0202  BP: 138/77 (!) 119/91  Pulse: 74 75  Resp: 16 18  Temp: 98.7 F (37.1 C) 98.6 F (37 C)  TempSrc:  Oral  SpO2: 98% 98%   Medications - No data to display  Vaginal bleeding in a postmenopausal woman is always a red flag for the possibility of cancer, particularly endometrial cancer.  The only specific abnormality seen on exam besides bleeding was the polypoid mass protruding from the cervical os.  We will schedule the patient for a pelvic ultrasound later today and refer to OB/GYN for further evaluation.  She and her daughter were advised that this could be cancer but even if it is cancer it could still be at a curable stage.  PROCEDURES  Procedures   ED DIAGNOSES     ICD-10-CM   1. Abnormal vaginal bleeding in postmenopausal patient  N95.0     2. Polyp at cervical os  N84.1          Kaizen Ibsen, MD 11/14/21 0300

## 2021-11-15 ENCOUNTER — Ambulatory Visit (HOSPITAL_BASED_OUTPATIENT_CLINIC_OR_DEPARTMENT_OTHER)
Admission: RE | Admit: 2021-11-15 | Discharge: 2021-11-15 | Disposition: A | Discharge status: Home / Self Care | Payer: Medicare HMO | Source: Ambulatory Visit | Attending: Emergency Medicine | Admitting: Emergency Medicine

## 2021-11-15 DIAGNOSIS — N841 Polyp of cervix uteri: Secondary | ICD-10-CM | POA: Diagnosis present

## 2021-11-15 DIAGNOSIS — N950 Postmenopausal bleeding: Secondary | ICD-10-CM | POA: Diagnosis present

## 2021-11-15 DIAGNOSIS — D259 Leiomyoma of uterus, unspecified: Secondary | ICD-10-CM | POA: Diagnosis not present

## 2022-01-06 ENCOUNTER — Other Ambulatory Visit: Payer: Self-pay | Admitting: Gynecologic Oncology

## 2022-01-06 ENCOUNTER — Telehealth: Payer: Self-pay

## 2022-01-06 ENCOUNTER — Encounter: Payer: Self-pay | Admitting: Psychiatry

## 2022-01-06 DIAGNOSIS — C541 Malignant neoplasm of endometrium: Secondary | ICD-10-CM

## 2022-01-06 NOTE — Progress Notes (Signed)
Patient with newly diagnosed high grade endometrial cancer. Plan for CT scan CAP to rule out metastatic disease prior to surgery.

## 2022-01-06 NOTE — Telephone Encounter (Signed)
Per Dr.Tucker and Joylene John NP,   Dr.Tucker is wanting [place] to try to get a CT scan prior to Monday, have the patient see April Lane on Monday, then possibly surgery on Tues.   I reached out to the, April Lane, pt's daughter and let her  know we are trying to expedite things for April Lane.Appointment for Monday 12/4 and possible CT scan prior to Monday was discussed. April Lane wants to talk to other family members to check on their availability.   I also let her know we are going to work on getting a CT scan scheduled for her to assess for any signs of spread.April Lane is aware of the urgent push, and is aware there is an opening for surgery on Tues 12/5. The 12th is open as well  but Dr. Berline Lopes wants to try for Tues.  April Lane wants to talk to other family members to check on their availability.    Pt is scheduled for Monday 12/4 with Dr.Newton at 1:45pm. STAT CT scheduled for 12/1 @ 4:00pm at [facility], arrive at 2:00 for oral contrast. NPO 4 hours prior.

## 2022-01-07 ENCOUNTER — Ambulatory Visit (HOSPITAL_COMMUNITY)
Admission: RE | Admit: 2022-01-07 | Discharge: 2022-01-07 | Disposition: A | Discharge status: Home / Self Care | Payer: Medicare HMO | Source: Ambulatory Visit | Attending: Gynecologic Oncology | Admitting: Gynecologic Oncology

## 2022-01-07 DIAGNOSIS — C541 Malignant neoplasm of endometrium: Secondary | ICD-10-CM | POA: Insufficient documentation

## 2022-01-07 MED ORDER — IOHEXOL 350 MG/ML SOLN
70.0000 mL | Freq: Once | INTRAVENOUS | Status: AC | PRN
  Administered 2022-01-07: 70 mL via INTRAVENOUS

## 2022-01-10 ENCOUNTER — Inpatient Hospital Stay: Payer: Medicare HMO | Attending: Psychiatry | Admitting: Psychiatry

## 2022-01-10 ENCOUNTER — Other Ambulatory Visit: Payer: Self-pay

## 2022-01-10 ENCOUNTER — Encounter: Payer: Self-pay | Admitting: Psychiatry

## 2022-01-10 ENCOUNTER — Inpatient Hospital Stay (HOSPITAL_BASED_OUTPATIENT_CLINIC_OR_DEPARTMENT_OTHER): Payer: Medicare HMO | Admitting: Gynecologic Oncology

## 2022-01-10 ENCOUNTER — Ambulatory Visit: Payer: Medicare HMO

## 2022-01-10 VITALS — BP 124/80 | HR 74 | Temp 98.7°F | Resp 14 | Ht 64.0 in | Wt 171.0 lb

## 2022-01-10 DIAGNOSIS — I10 Essential (primary) hypertension: Secondary | ICD-10-CM | POA: Insufficient documentation

## 2022-01-10 DIAGNOSIS — C541 Malignant neoplasm of endometrium: Secondary | ICD-10-CM

## 2022-01-10 DIAGNOSIS — M1990 Unspecified osteoarthritis, unspecified site: Secondary | ICD-10-CM | POA: Diagnosis not present

## 2022-01-10 DIAGNOSIS — M810 Age-related osteoporosis without current pathological fracture: Secondary | ICD-10-CM | POA: Diagnosis not present

## 2022-01-10 DIAGNOSIS — Z79899 Other long term (current) drug therapy: Secondary | ICD-10-CM | POA: Insufficient documentation

## 2022-01-10 DIAGNOSIS — K279 Peptic ulcer, site unspecified, unspecified as acute or chronic, without hemorrhage or perforation: Secondary | ICD-10-CM | POA: Insufficient documentation

## 2022-01-10 DIAGNOSIS — Z7982 Long term (current) use of aspirin: Secondary | ICD-10-CM | POA: Insufficient documentation

## 2022-01-10 DIAGNOSIS — E559 Vitamin D deficiency, unspecified: Secondary | ICD-10-CM | POA: Insufficient documentation

## 2022-01-10 MED ORDER — SENNOSIDES-DOCUSATE SODIUM 8.6-50 MG PO TABS: 2.0000 | ORAL_TABLET | Freq: Every day | ORAL | 0 refills | Status: DC

## 2022-01-10 MED ORDER — TRAMADOL HCL 50 MG PO TABS: 50.0000 mg | ORAL_TABLET | Freq: Four times a day (QID) | ORAL | 0 refills | Status: DC | PRN

## 2022-01-10 NOTE — Progress Notes (Unsigned)
GYNECOLOGIC ONCOLOGY NEW PATIENT CONSULTATION  Date of Service: 01/10/2022 Referring Provider: Hurshel Party, MD   ASSESSMENT AND PLAN: April Lane is a 81 y.o. 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  We reviewed the nature of endometrial cancer and its recommended surgical staging, including total hysterectomy, bilateral salpingo-oophorectomy, and lymph node assessment. The patient is a suitable candidate for staging via a minimally invasive approach to surgery.  We reviewed that robotic assistance would be used to complete the surgery.  We discussed that most endometrial cancer is detected early, however, we reviewed that adjuvant therapy will likely be recommended based on the patient's biopsy, however, we will defer to final pathology results.    We also reviewed expected outcomes if no treatment was desired.  Reviewed potential symptoms related to progression of disease.  We reviewed the sentinel lymph node technique. Risks and benefits of sentinel lymph node biopsy was reviewed. We reviewed the technique and ICG dye. The patient DOES NOT have an iodine allergy or known liver dysfunction. We reviewed the false negative rate (0.4%), and that 3% of patients with metastatic disease will not have it detected by SLN biopsy in endometrial cancer. A low risk of allergic reaction to the dye, <0.2% for ICG, has been reported. We also discussed that in the case of failed mapping, which occurs 40% of the time, a bilateral or unilateral lymphadenectomy will be performed at the surgeon's discretion.   Potential benefits of sentinel nodes including a higher detection rate for metastasis due to ultrastaging and potential reduction in operative morbidity. However, there remains uncertainty as to the role for treatment of micrometastatic disease. Further, the benefit of operative morbidity associated with the SLN technique in endometrial cancer is not  yet completely known. In other patient populations (e.g. the cervical cancer population) there has been observed reductions in morbidity with SLN biopsy compared to pelvic lymphadenectomy. Lymphedema, nerve dysfunction and lymphocysts are all potential risks with the SLN technique as with complete lymphadenectomy. Additional risks to the patient include the risk of damage to an internal organ while operating in an altered view (e.g. the black and white image of the robotic fluorescence imaging mode).   Patient was consented for: Robotic assisted total laparoscopic hysterectomy, bilateral salpingo-oophorectomy, sentinel lymph node evaluation and biopsy, possible lymph node dissection on 01/18/22.  The risks of surgery were discussed in detail and she understands these to including but not limited to bleeding requiring a blood transfusion, infection, injury to adjacent organs (including but not limited to the bowels, bladder, ureters, nerves, blood vessels), thromboembolic events, wound separation, hernia, vaginal cuff separation, possible risk of lymphedema and lymphocyst if lymphadenectomy performed, unforseen complication, and possible need for re-exploration.  If the patient experiences any of these events, she understands that her hospitalization or recovery may be prolonged and that she may need to take additional medications for a prolonged period. The patient will receive DVT and antibiotic prophylaxis as indicated. She voiced a clear understanding. She had the opportunity to ask questions and written informed consent was obtained today. She wishes to proceed.  Given indeterminate, but possibly benign, liver lesions on CT scan, will plan for MRI abdomen w/wo contrast but do not feel that this needs to delay surgery given small size. Will likely plan for sometime after surgery.  We will plan to collect a CA125 with patient's preop labs. She does not require preoperative clearance. Her METs are >4.   All preoperative  instructions were reviewed. Postoperative expectations were also reviewed.   A copy of this note was sent to the patient's referring provider.  Bernadene Bell, MD Gynecologic Oncology   Medical Decision Making I personally spent  TOTAL 50 minutes face-to-face and non-face-to-face in the care of this patient, which includes all pre, intra, and post visit time on the date of service.  5 minutes spent reviewing records prior to the visit 40 Minutes in patient contact 5 minutes charting , conferring with consultants etc.   ------------  CC: Endometrial cancer  HISTORY OF PRESENT ILLNESS:  April Lane is a 81 y.o. woman who is seen in consultation at the request of Hurshel Party, MD for evaluation of a new diagnosis of endometrial cancer.  Patient presented to her provider for postmenopausal bleeding. She underwent a pelvis ultrasound on 11/15/21 which noted a thickened and heterogeneous endometrium measuring 2 cm.  Uterus was also noted to have multiple small fibroids with the total uterus measuring 9.5 cm.  She subsequently underwent an endometrial biopsy which returned concerning for high-grade serous endometrial carcinoma.  She had a CT chest/abdomen/pelvis performed in 01/07/2022 which noted an abnormally thickened endometrium, 2 small foci in the liver which could represent incidental benign lesions, a trace left pleural effusion and a 2.3 cm left ovarian cyst.  No pathologic adenopathy.  Today, patient presents with her granddaughter.  She reports that she noticed a golf ball sized blood clot from her vagina in the beginning of October.  Due to this, she presented to her OB/GYN.  The bleeding is since stopped.  She otherwise denies abdominal bloating, early satiety, significant weight loss, change in bowel or bladder habits.   PAST MEDICAL HISTORY: Past Medical History:  Diagnosis Date   Arthritis    Cataract    left eye immature   Diverticulitis    Fatigue     GI bleed    many yrs ago   History of blood transfusion    no abnormal reactions   HTN (hypertension)    takes Amlodipine and Losartan daily   Nausea with vomiting 12/01/2012   Osteoporosis    steroid injection right knee a few yrs ago   Peptic ulcer    Vitamin D deficiency     PAST SURGICAL HISTORY: Past Surgical History:  Procedure Laterality Date   CHOLECYSTECTOMY  01/14/13   CHOLECYSTECTOMY N/A 01/14/2013   Procedure: LAPAROSCOPIC CHOLECYSTECTOMY WITH INTRAOPERATIVE CHOLANGIOGRAM;  Surgeon: Merrie Roof, MD;  Location: [facility];  Service: General;  Laterality: N/A;   TUBAL LIGATION     at age 5    OB/GYN HISTORY: OB History  No obstetric history on file.      Age at menarche: 54 Age at menopause: unknown Hx of HRT: no Hx of STI: no Last pap: unknown History of abnormal pap smears: no  SCREENING STUDIES:  Last mammogram: none Last colonoscopy: none  MEDICATIONS:  Current Outpatient Medications:    acetaminophen (TYLENOL) 500 MG tablet, Take 500 mg by mouth See admin instructions. Take 1 tablet (500 mg) by mouth every morning, may also take 1 tablet (500 mg) at night as needed for pain, Disp: , Rfl:    amLODipine (NORVASC) 5 MG tablet, Take 5 mg by mouth daily., Disp: , Rfl:    aspirin EC 81 MG tablet, Take 81 mg by mouth daily., Disp: , Rfl:    Bee Pollen 1000 MG TABS, Take 1,000 mg by mouth 2 (two) times daily., Disp: , Rfl:  diclofenac sodium (VOLTAREN) 1 % GEL, Apply 2 g topically 2 (two) times daily as needed (for pain/stiffness). , Disp: , Rfl:    losartan (COZAAR) 100 MG tablet, Take 100 mg by mouth daily., Disp: , Rfl:    meclizine (ANTIVERT) 25 MG tablet, Take 25 mg by mouth 2 (two) times daily., Disp: , Rfl: 1   Multiple Vitamins-Minerals (ADULT ONE DAILY GUMMIES) CHEW, Chew 1 tablet by mouth daily., Disp: , Rfl:    olmesartan (BENICAR) 40 MG tablet, Take 1 tablet by mouth daily., Disp: , Rfl:    simvastatin (ZOCOR) 20 MG tablet, Take 20 mg by mouth  at bedtime., Disp: , Rfl: 3  ALLERGIES: No Known Allergies  FAMILY HISTORY: Family History  Problem Relation Age of Onset   Other Mother 25       Died of old age   Heart attack Father 55       Died of natural causes   Prostate cancer Father    Prostate cancer Brother     SOCIAL HISTORY: Social History   Socioeconomic History   Marital status: Single    Spouse name: Not on file   Number of children: Not on file   Years of education: Not on file   Highest education level: Not on file  Occupational History   Occupation: CNA    Employer: OTHER  Tobacco Use   Smoking status: Never   Smokeless tobacco: Never  Substance and Sexual Activity   Alcohol use: No   Drug use: No   Sexual activity: Not on file  Other Topics Concern   Not on file  Social History Narrative   Not on file   Social Determinants of Health   Financial Resource Strain: Not on file  Food Insecurity: Not on file  Transportation Needs: Not on file  Physical Activity: Not on file  Stress: Not on file  Social Connections: Not on file  Intimate Partner Violence: Not on file    REVIEW OF SYSTEMS: New patient intake form was reviewed.  Complete 10-system review is negative except for the following: right knee joint pain, vaginal bleeding, problems walking (using cane)  PHYSICAL EXAM: BP 124/80 (BP Location: Left Arm, Patient Position: Sitting)   Pulse 74   Temp 98.7 F (37.1 C) (Oral)   Resp 14   Ht _0  (1.626 m)   Wt 171 lb (77.6 kg)   SpO2 99%   BMI 29.35 kg/m  Constitutional: No acute distress. Neuro/Psych: Alert, oriented.  Head and Neck: Normocephalic, atraumatic. Neck symmetric without masses. Sclera anicteric.  Respiratory: Normal work of breathing. Clear to auscultation bilaterally. Cardiovascular: Regular rate and rhythm, no murmurs, rubs, or gallops. Abdomen: Normoactive bowel sounds. Soft, non-distended, non-tender to palpation. No masses or hepatosplenomegaly appreciated. No  evidence of hernia. No palpable fluid wave. Well healed laparoscopic incisions. Extremities: using cane with ambulation due to knee pain. Warm, well perfused. No edema bilaterally. Skin: No rashes or lesions. Lymphatic: No cervical, supraclavicular, or inguinal adenopathy. Genitourinary: External genitalia without lesions. Urethral meatus without lesions or prolapse. On speculum exam, urinary incontinence noted. Anterior vaginal wall and apical prolapse to introitus, easily reduces. Otherwise vagina and cervix without lesions. Bimanual exam reveals normal cervix and small mobile uterus. Rectovaginal exam confirms the above findings and reveals normal sphincter tone and no masses or nodularity. Exam chaperoned by Joylene John, NP   LABORATORY AND RADIOLOGIC DATA: Outside medical records were reviewed to synthesize the above history, along with the history and physical  obtained during the visit.  Outside laboratory, pathology, and imaging reports were reviewed, with pertinent results below.  I personally reviewed the outside images.  WBC  Date Value Ref Range Status  11/13/2021 8.7 4.0 - 10.5 K/uL Final   Hemoglobin  Date Value Ref Range Status  11/13/2021 15.2 (H) 12.0 - 15.0 g/dL Final   HCT  Date Value Ref Range Status  11/13/2021 48.2 (H) 36.0 - 46.0 % Final   Platelets  Date Value Ref Range Status  11/13/2021 276 150 - 400 K/uL Final   Creatinine, Ser  Date Value Ref Range Status  11/13/2021 0.85 0.44 - 1.00 mg/dL Final   AST  Date Value Ref Range Status  05/09/2015 23 15 - 41 U/L Final   ALT  Date Value Ref Range Status  05/09/2015 12 (L) 14 - 54 U/L Final    Endometrial biopsy (outside) (12/17/21): High-grade endometrial carcinoma, p53 mutated, FIGO grade 3 Comment: The immunohistochemistry and morphology are most consistent with high-grade serous carcinomaPatient presented to her provider for postmenopausal bleeding.  CT CHEST ABDOMEN PELVIS W CONTRAST  01/07/2022  Narrative CLINICAL DATA:  Staging of endometrial cancer.  * Tracking Code: BO *  EXAM: CT CHEST, ABDOMEN, AND PELVIS WITH CONTRAST  TECHNIQUE: Multidetector CT imaging of the chest, abdomen and pelvis was performed following the standard protocol during bolus administration of intravenous contrast.  RADIATION DOSE REDUCTION: This exam was performed according to the departmental dose-optimization program which includes automated exposure control, adjustment of the mA and/or kV according to patient size and/or use of iterative reconstruction technique.  CONTRAST:  84m OMNIPAQUE IOHEXOL 350 MG/ML SOLN  COMPARISON:  Multiple exams, including CT scan 05/09/2015  FINDINGS: CT CHEST FINDINGS  Cardiovascular: Mild atherosclerotic calcification of the aortic arch.  Mediastinum/Nodes: Small hiatal hernia. Mildly dilated distal esophagus with an air-level internally.  Lungs/Pleura: Trace left pleural effusion. Mild atelectasis or scarring posteromedially in the left lower lobe on image 75 series 4.  Musculoskeletal: Unremarkable  CT ABDOMEN PELVIS FINDINGS  Hepatobiliary: Subtle 7 mm focus of portal venous phase enhancement in the lateral segment left hepatic lobe on image 44 series 3, cannot exclude a subtle enhancing lesion in this vicinity. Similar indistinct focus of enhancement in the right hepatic lobe measuring 1.6 by 1.2 cm on image 49 series 3. Neither definitively present on the prior exam. Both areas are isoenhancing on the delayed images.  Cholecystectomy noted.  Punctate calcifications in the liver likely due to remote inflammation.  Pancreas: Unremarkable  Spleen: Unremarkable  Adrenals/Urinary Tract: Stable small hypodense renal lesions are technically too small to characterize although statistically highly likely to be benign. No further imaging workup of these lesions is indicated.  Adrenal glands in urinary bladder  unremarkable.  Stomach/Bowel: Prominent stool throughout the colon favors constipation. Sigmoid colon diverticulosis.  Vascular/Lymphatic: Atherosclerosis is present, including aortoiliac atherosclerotic disease. No pathologic adenopathy.  Reproductive: Abnormally thickened endometrium at 2.5 cm compatible with endometrial mass. Right anterior uterine fibroid noted. Posterior uterine fibroid noted as well. Suspected left ovarian or adnexal cyst measuring 1.9 by 1.8 by 2.3 cm on image 93 series 3, simple fluid density.  Other: Trace free fluid adjacent to the right ovary as on image 101 series 6.  Musculoskeletal: Lower lumbar spondylosis and degenerative disc disease with suspected left foraminal impingement at L4-5 and L5-S1, and right foraminal impingement L4-5.  IMPRESSION: 1. Abnormally thickened endometrium at 2.5 cm compatible with endometrial malignancy. No adenopathy or specific findings of metastatic disease. 2. There  are 2 small foci of portal venous phase enhancement in the liver which are isoenhancing on the delayed images. These could be incidental benign lesions such as flash filling hemangiomas or transient hepatic attenuation difference, but given the patient's history of endometrial cancer, hepatic protocol MRI with and without contrast is recommended for definitive characterization. 3. Trace left pleural effusion. 4. Small hiatal hernia. Mildly dilated distal esophagus with an air-level internally, query reflux. 5. Sigmoid colon diverticulosis. 6. Prominent stool throughout the colon favors constipation. 7. Uterine fibroids. 8. 2.3 cm left ovarian or adnexal cyst. No follow-up imaging of this lesion recommended. Note: This recommendation does not apply to premenarchal patients and to those with increased risk (genetic, family history, elevated tumor markers or other high-risk factors) of ovarian cancer. Reference: JACR 2020 Feb; 17(2):248-254 9. Trace free  fluid adjacent to the right ovary. 10. Lower lumbar impingement at L4-5 and L5-S1. 11. Aortic atherosclerosis.  Aortic Atherosclerosis (ICD10-I70.0).   Electronically Signed By: Van Clines M.D. On: 01/07/2022 16:47   US PELVIC COMPLETE WITH TRANSVAGINAL 11/15/2021  Narrative CLINICAL DATA:  Postmenopausal bleeding, cervical polyp protruding at os  EXAM: TRANSABDOMINAL AND TRANSVAGINAL ULTRASOUND OF PELVIS  TECHNIQUE: Both transabdominal and transvaginal ultrasound examinations of the pelvis were performed. Transabdominal technique was performed for global imaging of the pelvis including uterus, ovaries, adnexal regions, and pelvic cul-de-sac. It was necessary to proceed with endovaginal exam following the transabdominal exam to visualize the uterus.  COMPARISON:  CT abdomen and pelvis dated May 09, 2015  FINDINGS: Uterus  Measurements: 9.5 x 6.1 x 6.6 cm = volume: 200.0 mL. Multi fibroid uterus, some of which are calcified. Partially calcified intramural fundal fibroid measuring 1.5 x 0.9 x 1.2 cm. Submucosal/intramural fibroid of the anterior uterine body measuring 2.7 x 3.7 2.5 cm. Intramural/submucosal fibroid of the lower uterine second measuring 2.7 x 2.0 x 1.9 cm. Additional large fibroids seen in the fundus of the uterus which deforms the endometrial cavity, not measured by sonographer.  Endometrium  Thickness: Abnormally thickened, difficult to accurately distortion by large fundal fibroid, approximately 2.0 cm in thickness. Heterogeneous and thickened endometrium with associated fluid. Known cervical polyp is not well visualized.  Right ovary  Not visualized, likely due to small size.  Left ovary  Not visualized, likely due to small size.  Other findings  No abnormal free fluid.  IMPRESSION: 1. Abnormally thickened and heterogeneous endometrium with associated fluid. In the setting of post-menopausal bleeding, endometrial sampling is  indicated to exclude carcinoma. If results are benign, sonohysterogram should be considered for focal lesion work-up. (Ref: Radiological Reasoning: Algorithmic Workup of Abnormal Vaginal Bleeding with Endovaginal Sonography and Sonohysterography. AJR 2008; [id number]:X45-03) 2. Multi fibroid uterus, some of which are calcified. 3. Nonvisualization of the ovaries.   Electronically Signed By: Yetta Glassman M.D. On: 11/15/2021 17:05

## 2022-01-10 NOTE — Patient Instructions (Signed)
Preparing for your Surgery  Plan for surgery on January 18, 2022 with Dr. Bernadene Bell at [place] will be scheduled for robotic assisted total laparoscopic hysterectomy (removal of the uterus and cervix), bilateral salpingo-oophorectomy (removal of both ovaries and fallopian tubes), sentinel lymph node biopsy, possible lymph node dissection, possible laparotomy (larger incision on your abdomen if needed).    Pre-operative Testing -You will receive a phone call from presurgical testing at [facility] to arrange for a pre-operative appointment and lab work.  -Bring your insurance card, copy of an advanced directive if applicable, medication list  -At that visit, you will be asked to sign a consent for a possible blood transfusion in case a transfusion becomes necessary during surgery.  The need for a blood transfusion is rare but having consent is a necessary part of your care.     -You can stop taking your aspirin starting tomorrow and do not take before surgery.  -Do not take supplements such as fish oil (omega 3), red yeast rice, turmeric before your surgery. You want to avoid medications with aspirin in them including headache powders such as BC or Goody's), Excedrin migraine.  Day Before Surgery at [place] will be asked to take in a light diet the day before surgery. You will be advised you can have clear liquids up until 3 hours before your surgery.    Eat a light diet the day before surgery.  Examples including soups, broths, toast, yogurt, mashed potatoes.  AVOID GAS PRODUCING FOODS. Things to avoid include carbonated beverages (fizzy beverages, sodas), raw fruits and raw vegetables (uncooked), or beans.   If your bowels are filled with gas, your surgeon will have difficulty visualizing your pelvic organs which increases your surgical risks.  Your role in recovery Your role is to become active as soon as directed by your doctor, while still giving  yourself time to heal.  Rest when you feel tired. You will be asked to do the following in order to speed your recovery:  - Cough and breathe deeply. This helps to clear and expand your lungs and can prevent pneumonia after surgery.  - [place]. Do mild physical activity. Walking or moving your legs help your circulation and body functions return to normal. Do not try to get up or walk alone the first time after surgery.   -If you develop swelling on one leg or the other, pain in the back of your leg, redness/warmth in one of your legs, please call the office or go to the Emergency Room to have a doppler to rule out a blood clot. For shortness of breath, chest pain-seek care in the Emergency Room as soon as possible. - Actively manage your pain. Managing your pain lets you move in comfort. We will ask you to rate your pain on a scale of zero to 10. It is your responsibility to tell your doctor or nurse where and how much you hurt so your pain can be treated.  We will plan to keep you overnight in the hospital and if doing well the next morning, we will send you home.  Special Considerations -If you are diabetic, you may be placed on insulin after surgery to have closer control over your blood sugars to promote healing and recovery.  This does not mean that you will be discharged on insulin.  If applicable, your oral antidiabetics will be resumed when you are tolerating a solid diet.  -Your  final pathology results from surgery should be available around one week after surgery and the results will be relayed to you when available.  -Dr. Lahoma Crocker is the surgeon that assists your GYN Oncologist with surgery.  If you end up staying the night, the next day after your surgery you will either see Dr. Berline Lopes, Dr. Ernestina Patches, or Dr. Lahoma Crocker.  -FMLA forms can be faxed to [phone] and please allow 5-7 business days for completion.  Pain Management After Surgery -You  have been prescribed your pain medication and bowel regimen medications before surgery so that you can have these available when you are discharged from the hospital. The pain medication is for use ONLY AFTER surgery and a new prescription will not be given.   -Make sure that you have Tylenol and Ibuprofen IF YOU ARE ABLE TO TAKE THESE MEDICATIONS at home to use on a regular basis after surgery for pain control. We recommend alternating the medications every hour to six hours since they work differently and are processed in the body differently for pain relief.  -Review the attached handout on narcotic use and their risks and side effects.   Bowel Regimen -You have been prescribed Sennakot-S to take nightly to prevent constipation especially if you are taking the narcotic pain medication intermittently.  It is important to prevent constipation and drink adequate amounts of liquids. You can stop taking this medication when you are not taking pain medication and you are back on your normal bowel routine.  Risks of Surgery Risks of surgery are low but include bleeding, infection, damage to surrounding structures, re-operation, blood clots, and very rarely death.   Blood Transfusion Information (For the consent to be signed before surgery)  We will be checking your blood type before surgery so in case of emergencies, we will know what type of blood you would need.                                            WHAT IS A BLOOD TRANSFUSION?  A transfusion is the replacement of blood or some of its parts. Blood is made up of multiple cells which provide different functions. Red blood cells carry oxygen and are used for blood loss replacement. White blood cells fight against infection. Platelets control bleeding. Plasma helps clot blood. Other blood products are available for specialized needs, such as hemophilia or other clotting disorders. BEFORE THE TRANSFUSION  Who gives blood for transfusions?   You may be able to donate blood to be used at a later date on yourself (autologous donation). Relatives can be asked to donate blood. This is generally not any safer than if you have received blood from a stranger. The same precautions are taken to ensure safety when a relative's blood is donated. Healthy volunteers who are fully evaluated to make sure their blood is safe. This is blood bank blood. Transfusion therapy is the safest it has ever been in the practice of medicine. Before blood is taken from a donor, a complete history is taken to make sure that person has no history of diseases nor engages in risky social behavior (examples are intravenous drug use or sexual activity with multiple partners). The donor's travel history is screened to minimize risk of transmitting infections, such as malaria. The donated blood is tested for signs of infectious diseases, such as HIV and hepatitis. The blood  is then tested to be sure it is compatible with you in order to minimize the chance of a transfusion reaction. If you or a relative donates blood, this is often done in anticipation of surgery and is not appropriate for emergency situations. It takes many days to process the donated blood. RISKS AND COMPLICATIONS Although transfusion therapy is very safe and saves many lives, the main dangers of transfusion include:  Getting an infectious disease. Developing a transfusion reaction. This is an allergic reaction to something in the blood you were given. Every precaution is taken to prevent this. The decision to have a blood transfusion has been considered carefully by your caregiver before blood is given. Blood is not given unless the benefits outweigh the risks.  AFTER SURGERY INSTRUCTIONS  Return to work: 4-6 weeks if applicable  Activity: 1. Be up and out of the bed during the day.  Take a nap if needed.  You may walk up steps but be careful and use the hand rail.  Stair climbing will tire you more  than you think, you may need to stop part way and rest.   2. No lifting or straining for 6 weeks over 10 pounds. No pushing, pulling, straining for 6 weeks.  3. No driving for around 1 week(s).  Do not drive if you are taking narcotic pain medicine and make sure that your reaction time has returned.   4. You can shower as soon as the next day after surgery. Shower daily.  Use your regular soap and water (not directly on the incision) and pat your incision(s) dry afterwards; don't rub.  No tub baths or submerging your body in water until cleared by your surgeon. If you have the soap that was given to you by pre-surgical testing that was used before surgery, you do not need to use it afterwards because this can irritate your incisions.   5. No sexual activity and nothing in the vagina for 8-10 weeks.  6. You may experience a small amount of clear drainage from your incisions, which is normal.  If the drainage persists, increases, or changes color please call the office.  7. Do not use creams, lotions, or ointments such as neosporin on your incisions after surgery until advised by your surgeon because they can cause removal of the dermabond glue on your incisions.    8. You may experience vaginal spotting after surgery or around the 6-8 week mark from surgery when the stitches at the top of the vagina begin to dissolve.  The spotting is normal but if you experience heavy bleeding, call our office.  9. Take Tylenol or ibuprofen first for pain if you are able to take these medications and only use narcotic pain medication for severe pain not relieved by the Tylenol or Ibuprofen.  Monitor your Tylenol intake to a max of 4,000 mg in a 24 hour period. You can alternate these medications after surgery.  Diet: 1. Low sodium Heart Healthy Diet is recommended but you are cleared to resume your normal (before surgery) diet after your procedure.  2. It is safe to use a laxative, such as Miralax or Colace, if  you have difficulty moving your bowels. You have been prescribed Sennakot-S to take at bedtime every evening after surgery to keep bowel movements regular and to prevent constipation.    Wound Care: 1. Keep clean and dry.  Shower daily.  Reasons to call the Doctor: Fever - Oral temperature greater than 100.4 degrees Fahrenheit Foul-smelling  vaginal discharge Difficulty urinating Nausea and vomiting Increased pain at the site of the incision that is unrelieved with pain medicine. Difficulty breathing with or without chest pain New calf pain especially if only on one side Sudden, continuing increased vaginal bleeding with or without clots.   Contacts: For questions or concerns you should contact:  Dr. Bernadene Bell at [place], NP at [phone]  After Hours: call [phone] and have the GYN Oncologist paged/contacted (after 5 pm or on the weekends).  Messages sent via mychart are for non-urgent matters and are not responded to after hours so for urgent needs, please call the after hours number.

## 2022-01-10 NOTE — H&P (View-Only) (Signed)
GYNECOLOGIC ONCOLOGY NEW PATIENT CONSULTATION  Date of Service: 01/10/2022 Referring Provider: Hurshel Party, MD   ASSESSMENT AND PLAN: April Lane is a 81 y.o. 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  We reviewed the nature of endometrial cancer and its recommended surgical staging, including total hysterectomy, bilateral salpingo-oophorectomy, and lymph node assessment. The patient is a suitable candidate for staging via a minimally invasive approach to surgery.  We reviewed that robotic assistance would be used to complete the surgery.  We discussed that most endometrial cancer is detected early, however, we reviewed that adjuvant therapy will likely be recommended based on the patient's biopsy, however, we will defer to final pathology results.    We also reviewed expected outcomes if no treatment was desired.  Reviewed potential symptoms related to progression of disease.  We reviewed the sentinel lymph node technique. Risks and benefits of sentinel lymph node biopsy was reviewed. We reviewed the technique and ICG dye. The patient DOES NOT have an iodine allergy or known liver dysfunction. We reviewed the false negative rate (0.4%), and that 3% of patients with metastatic disease will not have it detected by SLN biopsy in endometrial cancer. A low risk of allergic reaction to the dye, <0.2% for ICG, has been reported. We also discussed that in the case of failed mapping, which occurs 40% of the time, a bilateral or unilateral lymphadenectomy will be performed at the surgeon's discretion.   Potential benefits of sentinel nodes including a higher detection rate for metastasis due to ultrastaging and potential reduction in operative morbidity. However, there remains uncertainty as to the role for treatment of micrometastatic disease. Further, the benefit of operative morbidity associated with the SLN technique in endometrial cancer is not  yet completely known. In other patient populations (e.g. the cervical cancer population) there has been observed reductions in morbidity with SLN biopsy compared to pelvic lymphadenectomy. Lymphedema, nerve dysfunction and lymphocysts are all potential risks with the SLN technique as with complete lymphadenectomy. Additional risks to the patient include the risk of damage to an internal organ while operating in an altered view (e.g. the black and white image of the robotic fluorescence imaging mode).   Patient was consented for: Robotic assisted total laparoscopic hysterectomy, bilateral salpingo-oophorectomy, sentinel lymph node evaluation and biopsy, possible lymph node dissection on 01/18/22.  The risks of surgery were discussed in detail and she understands these to including but not limited to bleeding requiring a blood transfusion, infection, injury to adjacent organs (including but not limited to the bowels, bladder, ureters, nerves, blood vessels), thromboembolic events, wound separation, hernia, vaginal cuff separation, possible risk of lymphedema and lymphocyst if lymphadenectomy performed, unforseen complication, and possible need for re-exploration.  If the patient experiences any of these events, she understands that her hospitalization or recovery may be prolonged and that she may need to take additional medications for a prolonged period. The patient will receive DVT and antibiotic prophylaxis as indicated. She voiced a clear understanding. She had the opportunity to ask questions and written informed consent was obtained today. She wishes to proceed.  Given indeterminate, but possibly benign, liver lesions on CT scan, will plan for MRI abdomen w/wo contrast but do not feel that this needs to delay surgery given small size. Will likely plan for sometime after surgery.  We will plan to collect a CA125 with patient's preop labs. She does not require preoperative clearance. Her METs are >4.   All preoperative  instructions were reviewed. Postoperative expectations were also reviewed.   A copy of this note was sent to the patient's referring provider.  Bernadene Bell, MD Gynecologic Oncology   Medical Decision Making I personally spent  TOTAL 50 minutes face-to-face and non-face-to-face in the care of this patient, which includes all pre, intra, and post visit time on the date of service.  5 minutes spent reviewing records prior to the visit 40 Minutes in patient contact 5 minutes charting , conferring with consultants etc.   ------------  CC: Endometrial cancer  HISTORY OF PRESENT ILLNESS:  April Lane is a 81 y.o. woman who is seen in consultation at the request of Hurshel Party, MD for evaluation of a new diagnosis of endometrial cancer.  Patient presented to her provider for postmenopausal bleeding. She underwent a pelvis ultrasound on 11/15/21 which noted a thickened and heterogeneous endometrium measuring 2 cm.  Uterus was also noted to have multiple small fibroids with the total uterus measuring 9.5 cm.  She subsequently underwent an endometrial biopsy which returned concerning for high-grade serous endometrial carcinoma.  She had a CT chest/abdomen/pelvis performed in 01/07/2022 which noted an abnormally thickened endometrium, 2 small foci in the liver which could represent incidental benign lesions, a trace left pleural effusion and a 2.3 cm left ovarian cyst.  No pathologic adenopathy.  Today, patient presents with her granddaughter.  She reports that she noticed a golf ball sized blood clot from her vagina in the beginning of October.  Due to this, she presented to her OB/GYN.  The bleeding is since stopped.  She otherwise denies abdominal bloating, early satiety, significant weight loss, change in bowel or bladder habits.   PAST MEDICAL HISTORY: Past Medical History:  Diagnosis Date   Arthritis    Cataract    left eye immature   Diverticulitis    Fatigue     GI bleed    many yrs ago   History of blood transfusion    no abnormal reactions   HTN (hypertension)    takes Amlodipine and Losartan daily   Nausea with vomiting 12/01/2012   Osteoporosis    steroid injection right knee a few yrs ago   Peptic ulcer    Vitamin D deficiency     PAST SURGICAL HISTORY: Past Surgical History:  Procedure Laterality Date   CHOLECYSTECTOMY  01/14/13   CHOLECYSTECTOMY N/A 01/14/2013   Procedure: LAPAROSCOPIC CHOLECYSTECTOMY WITH INTRAOPERATIVE CHOLANGIOGRAM;  Surgeon: Merrie Roof, MD;  Location: [facility];  Service: General;  Laterality: N/A;   TUBAL LIGATION     at age 51    OB/GYN HISTORY: OB History  No obstetric history on file.      Age at menarche: 40 Age at menopause: unknown Hx of HRT: no Hx of STI: no Last pap: unknown History of abnormal pap smears: no  SCREENING STUDIES:  Last mammogram: none Last colonoscopy: none  MEDICATIONS:  Current Outpatient Medications:    acetaminophen (TYLENOL) 500 MG tablet, Take 500 mg by mouth See admin instructions. Take 1 tablet (500 mg) by mouth every morning, may also take 1 tablet (500 mg) at night as needed for pain, Disp: , Rfl:    amLODipine (NORVASC) 5 MG tablet, Take 5 mg by mouth daily., Disp: , Rfl:    aspirin EC 81 MG tablet, Take 81 mg by mouth daily., Disp: , Rfl:    Bee Pollen 1000 MG TABS, Take 1,000 mg by mouth 2 (two) times daily., Disp: , Rfl:  diclofenac sodium (VOLTAREN) 1 % GEL, Apply 2 g topically 2 (two) times daily as needed (for pain/stiffness). , Disp: , Rfl:    losartan (COZAAR) 100 MG tablet, Take 100 mg by mouth daily., Disp: , Rfl:    meclizine (ANTIVERT) 25 MG tablet, Take 25 mg by mouth 2 (two) times daily., Disp: , Rfl: 1   Multiple Vitamins-Minerals (ADULT ONE DAILY GUMMIES) CHEW, Chew 1 tablet by mouth daily., Disp: , Rfl:    olmesartan (BENICAR) 40 MG tablet, Take 1 tablet by mouth daily., Disp: , Rfl:    simvastatin (ZOCOR) 20 MG tablet, Take 20 mg by mouth  at bedtime., Disp: , Rfl: 3  ALLERGIES: No Known Allergies  FAMILY HISTORY: Family History  Problem Relation Age of Onset   Other Mother 64       Died of old age   Heart attack Father 59       Died of natural causes   Prostate cancer Father    Prostate cancer Brother     SOCIAL HISTORY: Social History   Socioeconomic History   Marital status: Single    Spouse name: Not on file   Number of children: Not on file   Years of education: Not on file   Highest education level: Not on file  Occupational History   Occupation: CNA    Employer: OTHER  Tobacco Use   Smoking status: Never   Smokeless tobacco: Never  Substance and Sexual Activity   Alcohol use: No   Drug use: No   Sexual activity: Not on file  Other Topics Concern   Not on file  Social History Narrative   Not on file   Social Determinants of Health   Financial Resource Strain: Not on file  Food Insecurity: Not on file  Transportation Needs: Not on file  Physical Activity: Not on file  Stress: Not on file  Social Connections: Not on file  Intimate Partner Violence: Not on file    REVIEW OF SYSTEMS: New patient intake form was reviewed.  Complete 10-system review is negative except for the following: right knee joint pain, vaginal bleeding, problems walking (using cane)  PHYSICAL EXAM: BP 124/80 (BP Location: Left Arm, Patient Position: Sitting)   Pulse 74   Temp 98.7 F (37.1 C) (Oral)   Resp 14   Ht _0  (1.626 m)   Wt 171 lb (77.6 kg)   SpO2 99%   BMI 29.35 kg/m  Constitutional: No acute distress. Neuro/Psych: Alert, oriented.  Head and Neck: Normocephalic, atraumatic. Neck symmetric without masses. Sclera anicteric.  Respiratory: Normal work of breathing. Clear to auscultation bilaterally. Cardiovascular: Regular rate and rhythm, no murmurs, rubs, or gallops. Abdomen: Normoactive bowel sounds. Soft, non-distended, non-tender to palpation. No masses or hepatosplenomegaly appreciated. No  evidence of hernia. No palpable fluid wave. Well healed laparoscopic incisions. Extremities: using cane with ambulation due to knee pain. Warm, well perfused. No edema bilaterally. Skin: No rashes or lesions. Lymphatic: No cervical, supraclavicular, or inguinal adenopathy. Genitourinary: External genitalia without lesions. Urethral meatus without lesions or prolapse. On speculum exam, urinary incontinence noted. Anterior vaginal wall and apical prolapse to introitus, easily reduces. Otherwise vagina and cervix without lesions. Bimanual exam reveals normal cervix and small mobile uterus. Rectovaginal exam confirms the above findings and reveals normal sphincter tone and no masses or nodularity. Exam chaperoned by Joylene John, NP   LABORATORY AND RADIOLOGIC DATA: Outside medical records were reviewed to synthesize the above history, along with the history and physical  obtained during the visit.  Outside laboratory, pathology, and imaging reports were reviewed, with pertinent results below.  I personally reviewed the outside images.  WBC  Date Value Ref Range Status  11/13/2021 8.7 4.0 - 10.5 K/uL Final   Hemoglobin  Date Value Ref Range Status  11/13/2021 15.2 (H) 12.0 - 15.0 g/dL Final   HCT  Date Value Ref Range Status  11/13/2021 48.2 (H) 36.0 - 46.0 % Final   Platelets  Date Value Ref Range Status  11/13/2021 276 150 - 400 K/uL Final   Creatinine, Ser  Date Value Ref Range Status  11/13/2021 0.85 0.44 - 1.00 mg/dL Final   AST  Date Value Ref Range Status  05/09/2015 23 15 - 41 U/L Final   ALT  Date Value Ref Range Status  05/09/2015 12 (L) 14 - 54 U/L Final    Endometrial biopsy (outside) (12/17/21): High-grade endometrial carcinoma, p53 mutated, FIGO grade 3 Comment: The immunohistochemistry and morphology are most consistent with high-grade serous carcinomaPatient presented to her provider for postmenopausal bleeding.  CT CHEST ABDOMEN PELVIS W CONTRAST  01/07/2022  Narrative CLINICAL DATA:  Staging of endometrial cancer.  * Tracking Code: BO *  EXAM: CT CHEST, ABDOMEN, AND PELVIS WITH CONTRAST  TECHNIQUE: Multidetector CT imaging of the chest, abdomen and pelvis was performed following the standard protocol during bolus administration of intravenous contrast.  RADIATION DOSE REDUCTION: This exam was performed according to the departmental dose-optimization program which includes automated exposure control, adjustment of the mA and/or kV according to patient size and/or use of iterative reconstruction technique.  CONTRAST:  90m OMNIPAQUE IOHEXOL 350 MG/ML SOLN  COMPARISON:  Multiple exams, including CT scan 05/09/2015  FINDINGS: CT CHEST FINDINGS  Cardiovascular: Mild atherosclerotic calcification of the aortic arch.  Mediastinum/Nodes: Small hiatal hernia. Mildly dilated distal esophagus with an air-level internally.  Lungs/Pleura: Trace left pleural effusion. Mild atelectasis or scarring posteromedially in the left lower lobe on image 75 series 4.  Musculoskeletal: Unremarkable  CT ABDOMEN PELVIS FINDINGS  Hepatobiliary: Subtle 7 mm focus of portal venous phase enhancement in the lateral segment left hepatic lobe on image 44 series 3, cannot exclude a subtle enhancing lesion in this vicinity. Similar indistinct focus of enhancement in the right hepatic lobe measuring 1.6 by 1.2 cm on image 49 series 3. Neither definitively present on the prior exam. Both areas are isoenhancing on the delayed images.  Cholecystectomy noted.  Punctate calcifications in the liver likely due to remote inflammation.  Pancreas: Unremarkable  Spleen: Unremarkable  Adrenals/Urinary Tract: Stable small hypodense renal lesions are technically too small to characterize although statistically highly likely to be benign. No further imaging workup of these lesions is indicated.  Adrenal glands in urinary bladder  unremarkable.  Stomach/Bowel: Prominent stool throughout the colon favors constipation. Sigmoid colon diverticulosis.  Vascular/Lymphatic: Atherosclerosis is present, including aortoiliac atherosclerotic disease. No pathologic adenopathy.  Reproductive: Abnormally thickened endometrium at 2.5 cm compatible with endometrial mass. Right anterior uterine fibroid noted. Posterior uterine fibroid noted as well. Suspected left ovarian or adnexal cyst measuring 1.9 by 1.8 by 2.3 cm on image 93 series 3, simple fluid density.  Other: Trace free fluid adjacent to the right ovary as on image 101 series 6.  Musculoskeletal: Lower lumbar spondylosis and degenerative disc disease with suspected left foraminal impingement at L4-5 and L5-S1, and right foraminal impingement L4-5.  IMPRESSION: 1. Abnormally thickened endometrium at 2.5 cm compatible with endometrial malignancy. No adenopathy or specific findings of metastatic disease. 2. There  are 2 small foci of portal venous phase enhancement in the liver which are isoenhancing on the delayed images. These could be incidental benign lesions such as flash filling hemangiomas or transient hepatic attenuation difference, but given the patient's history of endometrial cancer, hepatic protocol MRI with and without contrast is recommended for definitive characterization. 3. Trace left pleural effusion. 4. Small hiatal hernia. Mildly dilated distal esophagus with an air-level internally, query reflux. 5. Sigmoid colon diverticulosis. 6. Prominent stool throughout the colon favors constipation. 7. Uterine fibroids. 8. 2.3 cm left ovarian or adnexal cyst. No follow-up imaging of this lesion recommended. Note: This recommendation does not apply to premenarchal patients and to those with increased risk (genetic, family history, elevated tumor markers or other high-risk factors) of ovarian cancer. Reference: JACR 2020 Feb; 17(2):248-254 9. Trace free  fluid adjacent to the right ovary. 10. Lower lumbar impingement at L4-5 and L5-S1. 11. Aortic atherosclerosis.  Aortic Atherosclerosis (ICD10-I70.0).   Electronically Signed By: Van Clines M.D. On: 01/07/2022 16:47   US PELVIC COMPLETE WITH TRANSVAGINAL 11/15/2021  Narrative CLINICAL DATA:  Postmenopausal bleeding, cervical polyp protruding at os  EXAM: TRANSABDOMINAL AND TRANSVAGINAL ULTRASOUND OF PELVIS  TECHNIQUE: Both transabdominal and transvaginal ultrasound examinations of the pelvis were performed. Transabdominal technique was performed for global imaging of the pelvis including uterus, ovaries, adnexal regions, and pelvic cul-de-sac. It was necessary to proceed with endovaginal exam following the transabdominal exam to visualize the uterus.  COMPARISON:  CT abdomen and pelvis dated May 09, 2015  FINDINGS: Uterus  Measurements: 9.5 x 6.1 x 6.6 cm = volume: 200.0 mL. Multi fibroid uterus, some of which are calcified. Partially calcified intramural fundal fibroid measuring 1.5 x 0.9 x 1.2 cm. Submucosal/intramural fibroid of the anterior uterine body measuring 2.7 x 3.7 2.5 cm. Intramural/submucosal fibroid of the lower uterine second measuring 2.7 x 2.0 x 1.9 cm. Additional large fibroids seen in the fundus of the uterus which deforms the endometrial cavity, not measured by sonographer.  Endometrium  Thickness: Abnormally thickened, difficult to accurately distortion by large fundal fibroid, approximately 2.0 cm in thickness. Heterogeneous and thickened endometrium with associated fluid. Known cervical polyp is not well visualized.  Right ovary  Not visualized, likely due to small size.  Left ovary  Not visualized, likely due to small size.  Other findings  No abnormal free fluid.  IMPRESSION: 1. Abnormally thickened and heterogeneous endometrium with associated fluid. In the setting of post-menopausal bleeding, endometrial sampling is  indicated to exclude carcinoma. If results are benign, sonohysterogram should be considered for focal lesion work-up. (Ref: Radiological Reasoning: Algorithmic Workup of Abnormal Vaginal Bleeding with Endovaginal Sonography and Sonohysterography. AJR 2008; 353:I14-43) 2. Multi fibroid uterus, some of which are calcified. 3. Nonvisualization of the ovaries.   Electronically Signed By: Yetta Glassman M.D. On: 11/15/2021 17:05

## 2022-01-11 ENCOUNTER — Encounter: Payer: Self-pay | Admitting: Psychiatry

## 2022-01-12 NOTE — Patient Instructions (Addendum)
DUE TO COVID-19 ONLY TWO VISITORS  (aged 80 and older)  ARE ALLOWED TO COME WITH YOU AND STAY IN THE WAITING ROOM ONLY DURING PRE OP AND PROCEDURE.   **NO VISITORS ARE ALLOWED IN THE SHORT STAY AREA OR RECOVERY ROOM!!**  IF YOU WILL BE ADMITTED INTO THE HOSPITAL YOU ARE ALLOWED ONLY FOUR SUPPORT PEOPLE DURING VISITATION HOURS ONLY (7 AM -8PM)   The support person(s) must pass our screening, gel in and out, and wear a mask at all times, including in the patient's room. Patients must also wear a mask when staff or their support person are in the room. Visitors GUEST BADGE MUST BE WORN VISIBLY  One adult visitor may remain with you overnight and MUST be in the room by 8 P.M.     Your procedure is scheduled on: 01/18/22   Report to [facility] Main Entrance    Report to admitting at: 6:45 AM   Call this number if you have problems the morning of surgery [phone]   Eat a light diet the day before surgery.(Avoid gas producing foods)  Do not eat food :After Midnight.   After Midnight you may have the following liquids until: 6:00  AM DAY OF SURGERY  Water Black Coffee (sugar ok, NO MILK/CREAM OR CREAMERS)  Tea (sugar ok, NO MILK/CREAM OR CREAMERS) regular and decaf                             Plain Jell-O (NO RED)                                           Fruit ices (not with fruit pulp, NO RED)                                     Popsicles (NO RED)                                                                  Juice: apple, WHITE grape, WHITE cranberry Sports drinks like Gatorade (NO RED)    Oral Hygiene is also important to reduce your risk of infection.                                    Remember - BRUSH YOUR TEETH THE MORNING OF SURGERY WITH YOUR REGULAR TOOTHPASTE  DENTURES WILL BE REMOVED PRIOR TO SURGERY PLEASE DO NOT APPLY "Poly grip" OR ADHESIVES!!!   Do NOT smoke after Midnight   Take these medicines the morning of surgery with A SIP OF WATER:   Amlodipine Tylenol as needed..                              You may not have any metal on your body including hair pins, jewelry, and body piercing             Do not wear make-up, lotions, powders, perfumes  or deodorant  Do not wear nail polish including gel and S&S, artificial/acrylic nails, or any other type of covering on natural nails including finger and toenails. If you have artificial nails, gel coating, etc. that needs to be removed by a nail salon please have this removed prior to surgery or surgery may need to be canceled/ delayed if the surgeon/ anesthesia feels like they are unable to be safely monitored.   Do not shave  48 hours prior to surgery.    Do not bring valuables to the hospital. [place].   Contacts, glasses, or bridgework may not be worn into surgery.   Bring small overnight bag day of surgery.   DO NOT [place]. PHARMACY WILL DISPENSE MEDICATIONS LISTED ON YOUR MEDICATION LIST TO YOU DURING YOUR ADMISSION [place]!    Patients discharged on the day of surgery will not be allowed to drive home.  Someone NEEDS to stay with you for the first 24 hours after anesthesia.   Special Instructions: Bring a copy of your healthcare power of attorney and living will documents         the day of surgery if you haven't scanned them before.              Please read over the following fact sheets you were given: IF YOU HAVE QUESTIONS ABOUT YOUR PRE-OP INSTRUCTIONS PLEASE CALL [facility] - Preparing for Surgery Before surgery, you can play an important role.  Because skin is not sterile, your skin needs to be as free of germs as possible.  You can reduce the number of germs on your skin by washing with CHG (chlorahexidine gluconate) soap before surgery.  CHG is an antiseptic cleaner which kills germs and bonds with the skin to continue killing germs even after  washing. Please DO NOT use if you have an allergy to CHG or antibacterial soaps.  If your skin becomes reddened/irritated stop using the CHG and inform your nurse when you arrive at Short Stay. Do not shave (including legs and underarms) for at least 48 hours prior to the first CHG shower.  You may shave your face/neck. Please follow these instructions carefully:  1.  Shower with CHG Soap the night before surgery and the  morning of Surgery.  2.  If you choose to wash your hair, wash your hair first as usual with your  normal  shampoo.  3.  After you shampoo, rinse your hair and body thoroughly to remove the  shampoo.                           4.  Use CHG as you would any other liquid soap.  You can apply chg directly  to the skin and wash                       Gently with a scrungie or clean washcloth.  5.  Apply the CHG Soap to your body ONLY FROM THE NECK DOWN.   Do not use on face/ open                           Wound or open sores. Avoid contact with eyes, ears mouth and genitals (private parts).  Wash face,  Genitals (private parts) with your normal soap.             6.  Wash thoroughly, paying special attention to the area where your surgery  will be performed.  7.  Thoroughly rinse your body with warm water from the neck down.  8.  DO NOT shower/wash with your normal soap after using and rinsing off  the CHG Soap.                9.  Pat yourself dry with a clean towel.            10.  Wear clean pajamas.            11.  Place clean sheets on your bed the night of your first shower and do not  sleep with pets. Day of Surgery : Do not apply any lotions/deodorants the morning of surgery.  Please wear clean clothes to the hospital/surgery center.  FAILURE TO FOLLOW THESE INSTRUCTIONS MAY RESULT IN THE CANCELLATION OF YOUR SURGERY PATIENT SIGNATURE_________________________________  NURSE  SIGNATURE__________________________________  ________________________________________________________________________ WHAT IS A BLOOD TRANSFUSION? Blood Transfusion Information  A transfusion is the replacement of blood or some of its parts. Blood is made up of multiple cells which provide different functions. Red blood cells carry oxygen and are used for blood loss replacement. White blood cells fight against infection. Platelets control bleeding. Plasma helps clot blood. Other blood products are available for specialized needs, such as hemophilia or other clotting disorders. BEFORE THE TRANSFUSION  Who gives blood for transfusions?  Healthy volunteers who are fully evaluated to make sure their blood is safe. This is blood bank blood. Transfusion therapy is the safest it has ever been in the practice of medicine. Before blood is taken from a donor, a complete history is taken to make sure that person has no history of diseases nor engages in risky social behavior (examples are intravenous drug use or sexual activity with multiple partners). The donor's travel history is screened to minimize risk of transmitting infections, such as malaria. The donated blood is tested for signs of infectious diseases, such as HIV and hepatitis. The blood is then tested to be sure it is compatible with you in order to minimize the chance of a transfusion reaction. If you or a relative donates blood, this is often done in anticipation of surgery and is not appropriate for emergency situations. It takes many days to process the donated blood. RISKS AND COMPLICATIONS Although transfusion therapy is very safe and saves many lives, the main dangers of transfusion include:  Getting an infectious disease. Developing a transfusion reaction. This is an allergic reaction to something in the blood you were given. Every precaution is taken to prevent this. The decision to have a blood transfusion has been considered carefully  by your caregiver before blood is given. Blood is not given unless the benefits outweigh the risks. AFTER THE TRANSFUSION Right after receiving a blood transfusion, you will usually feel much better and more energetic. This is especially true if your red blood cells have gotten low (anemic). The transfusion raises the level of the red blood cells which carry oxygen, and this usually causes an energy increase. The nurse administering the transfusion will monitor you carefully for complications. HOME CARE INSTRUCTIONS  No special instructions are needed after a transfusion. You may find your energy is better. Speak with your caregiver about any limitations on activity for underlying diseases you may have. [place]  CARE IF:  Your condition is not improving after your transfusion. You develop redness or irritation at the intravenous (IV) site. SEEK IMMEDIATE MEDICAL CARE IF:  Any of the following symptoms occur over the next 12 hours: Shaking chills. You have a temperature by mouth above 102 F (38.9 C), not controlled by medicine. Chest, back, or muscle pain. People around you feel you are not acting correctly or are confused. Shortness of breath or difficulty breathing. Dizziness and fainting. You get a rash or develop hives. You have a decrease in urine output. Your urine turns a dark color or changes to pink, red, or brown. Any of the following symptoms occur over the next 10 days: You have a temperature by mouth above 102 F (38.9 C), not controlled by medicine. Shortness of breath. Weakness after normal activity. The white part of the eye turns yellow (jaundice). You have a decrease in the amount of urine or are urinating less often. Your urine turns a dark color or changes to pink, red, or brown. Document Released: 01/22/2000 Document Revised: 04/18/2011 Document Reviewed: 09/10/2007 [facility] Patient Information 2014 [place],  [place].  _______________________________________________________________________

## 2022-01-13 ENCOUNTER — Other Ambulatory Visit (HOSPITAL_COMMUNITY): Payer: Medicare HMO

## 2022-01-14 NOTE — Patient Instructions (Signed)
Preparing for your Surgery   Plan for surgery on January 18, 2022 with Dr. Bernadene Bell at [facility] will be scheduled for robotic assisted total laparoscopic hysterectomy (removal of the uterus and cervix), bilateral salpingo-oophorectomy (removal of both ovaries and fallopian tubes), sentinel lymph node biopsy, possible lymph node dissection, possible laparotomy (larger incision on your abdomen if needed).     Pre-operative Testing -You will receive a phone call from presurgical testing at [facility] to arrange for a pre-operative appointment and lab work.   -Bring your insurance card, copy of an advanced directive if applicable, medication list   -At that visit, you will be asked to sign a consent for a possible blood transfusion in case a transfusion becomes necessary during surgery.  The need for a blood transfusion is rare but having consent is a necessary part of your care.      -You can stop taking your aspirin starting tomorrow and do not take before surgery.   -Do not take supplements such as fish oil (omega 3), red yeast rice, turmeric before your surgery. You want to avoid medications with aspirin in them including headache powders such as BC or Goody's), Excedrin migraine.   Day Before Surgery at [place] will be asked to take in a light diet the day before surgery. You will be advised you can have clear liquids up until 3 hours before your surgery.     Eat a light diet the day before surgery.  Examples including soups, broths, toast, yogurt, mashed potatoes.  AVOID GAS PRODUCING FOODS. Things to avoid include carbonated beverages (fizzy beverages, sodas), raw fruits and raw vegetables (uncooked), or beans.    If your bowels are filled with gas, your surgeon will have difficulty visualizing your pelvic organs which increases your surgical risks.   Your role in recovery Your role is to become active as soon as directed by your doctor, while still  giving yourself time to heal.  Rest when you feel tired. You will be asked to do the following in order to speed your recovery:   - Cough and breathe deeply. This helps to clear and expand your lungs and can prevent pneumonia after surgery.  - [place]. Do mild physical activity. Walking or moving your legs help your circulation and body functions return to normal. Do not try to get up or walk alone the first time after surgery.   -If you develop swelling on one leg or the other, pain in the back of your leg, redness/warmth in one of your legs, please call the office or go to the Emergency Room to have a doppler to rule out a blood clot. For shortness of breath, chest pain-seek care in the Emergency Room as soon as possible. - Actively manage your pain. Managing your pain lets you move in comfort. We will ask you to rate your pain on a scale of zero to 10. It is your responsibility to tell your doctor or nurse where and how much you hurt so your pain can be treated.   We will plan to keep you overnight in the hospital and if doing well the next morning, we will send you home.   Special Considerations -If you are diabetic, you may be placed on insulin after surgery to have closer control over your blood sugars to promote healing and recovery.  This does not mean that you will be discharged on insulin.  If applicable, your oral  antidiabetics will be resumed when you are tolerating a solid diet.   -Your final pathology results from surgery should be available around one week after surgery and the results will be relayed to you when available.   -Dr. Lahoma Crocker is the surgeon that assists your GYN Oncologist with surgery.  If you end up staying the night, the next day after your surgery you will either see Dr. Berline Lopes, Dr. Ernestina Patches, or Dr. Lahoma Crocker.   -FMLA forms can be faxed to [phone] and please allow 5-7 business days for completion.   Pain Management After  Surgery -You have been prescribed your pain medication and bowel regimen medications before surgery so that you can have these available when you are discharged from the hospital. The pain medication is for use ONLY AFTER surgery and a new prescription will not be given.    -Make sure that you have Tylenol and Ibuprofen IF YOU ARE ABLE TO TAKE THESE MEDICATIONS at home to use on a regular basis after surgery for pain control. We recommend alternating the medications every hour to six hours since they work differently and are processed in the body differently for pain relief.   -Review the attached handout on narcotic use and their risks and side effects.    Bowel Regimen -You have been prescribed Sennakot-S to take nightly to prevent constipation especially if you are taking the narcotic pain medication intermittently.  It is important to prevent constipation and drink adequate amounts of liquids. You can stop taking this medication when you are not taking pain medication and you are back on your normal bowel routine.   Risks of Surgery Risks of surgery are low but include bleeding, infection, damage to surrounding structures, re-operation, blood clots, and very rarely death.     Blood Transfusion Information (For the consent to be signed before surgery)   We will be checking your blood type before surgery so in case of emergencies, we will know what type of blood you would need.                                             WHAT IS A BLOOD TRANSFUSION?   A transfusion is the replacement of blood or some of its parts. Blood is made up of multiple cells which provide different functions. Red blood cells carry oxygen and are used for blood loss replacement. White blood cells fight against infection. Platelets control bleeding. Plasma helps clot blood. Other blood products are available for specialized needs, such as hemophilia or other clotting disorders. BEFORE THE TRANSFUSION  Who gives  blood for transfusions?  You may be able to donate blood to be used at a later date on yourself (autologous donation). Relatives can be asked to donate blood. This is generally not any safer than if you have received blood from a stranger. The same precautions are taken to ensure safety when a relative's blood is donated. Healthy volunteers who are fully evaluated to make sure their blood is safe. This is blood bank blood. Transfusion therapy is the safest it has ever been in the practice of medicine. Before blood is taken from a donor, a complete history is taken to make sure that person has no history of diseases nor engages in risky social behavior (examples are intravenous drug use or sexual activity with multiple partners). The donor's travel history is screened  to minimize risk of transmitting infections, such as malaria. The donated blood is tested for signs of infectious diseases, such as HIV and hepatitis. The blood is then tested to be sure it is compatible with you in order to minimize the chance of a transfusion reaction. If you or a relative donates blood, this is often done in anticipation of surgery and is not appropriate for emergency situations. It takes many days to process the donated blood. RISKS AND COMPLICATIONS Although transfusion therapy is very safe and saves many lives, the main dangers of transfusion include:  Getting an infectious disease. Developing a transfusion reaction. This is an allergic reaction to something in the blood you were given. Every precaution is taken to prevent this. The decision to have a blood transfusion has been considered carefully by your caregiver before blood is given. Blood is not given unless the benefits outweigh the risks.   AFTER SURGERY INSTRUCTIONS   Return to work: 4-6 weeks if applicable   Activity: 1. Be up and out of the bed during the day.  Take a nap if needed.  You may walk up steps but be careful and use the hand rail.  Stair  climbing will tire you more than you think, you may need to stop part way and rest.    2. No lifting or straining for 6 weeks over 10 pounds. No pushing, pulling, straining for 6 weeks.   3. No driving for around 1 week(s).  Do not drive if you are taking narcotic pain medicine and make sure that your reaction time has returned.    4. You can shower as soon as the next day after surgery. Shower daily.  Use your regular soap and water (not directly on the incision) and pat your incision(s) dry afterwards; don't rub.  No tub baths or submerging your body in water until cleared by your surgeon. If you have the soap that was given to you by pre-surgical testing that was used before surgery, you do not need to use it afterwards because this can irritate your incisions.    5. No sexual activity and nothing in the vagina for 8-10 weeks.   6. You may experience a small amount of clear drainage from your incisions, which is normal.  If the drainage persists, increases, or changes color please call the office.   7. Do not use creams, lotions, or ointments such as neosporin on your incisions after surgery until advised by your surgeon because they can cause removal of the dermabond glue on your incisions.     8. You may experience vaginal spotting after surgery or around the 6-8 week mark from surgery when the stitches at the top of the vagina begin to dissolve.  The spotting is normal but if you experience heavy bleeding, call our office.   9. Take Tylenol or ibuprofen first for pain if you are able to take these medications and only use narcotic pain medication for severe pain not relieved by the Tylenol or Ibuprofen.  Monitor your Tylenol intake to a max of 4,000 mg in a 24 hour period. You can alternate these medications after surgery.   Diet: 1. Low sodium Heart Healthy Diet is recommended but you are cleared to resume your normal (before surgery) diet after your procedure.   2. It is safe to use a  laxative, such as Miralax or Colace, if you have difficulty moving your bowels. You have been prescribed Sennakot-S to take at bedtime every evening after  surgery to keep bowel movements regular and to prevent constipation.     Wound Care: 1. Keep clean and dry.  Shower daily.   Reasons to call the Doctor: Fever - Oral temperature greater than 100.4 degrees Fahrenheit Foul-smelling vaginal discharge Difficulty urinating Nausea and vomiting Increased pain at the site of the incision that is unrelieved with pain medicine. Difficulty breathing with or without chest pain New calf pain especially if only on one side Sudden, continuing increased vaginal bleeding with or without clots.   Contacts: For questions or concerns you should contact:   Dr. Bernadene Bell at [phone]   Joylene John, NP at [phone]

## 2022-01-14 NOTE — Progress Notes (Signed)
Patient here for new patient consultation with Dr. Ernestina Patches and for a pre-operative appointment prior to her scheduled surgery on January 18, 2022. She is scheduled for a robotic assisted total laparoscopic hysterectomy, bilateral salpingo-oophorectomy, sentinel lymph node biopsy, possible lymph node dissection, possible laparotomy. The surgery was discussed in detail.  See after visit summary for additional details. Visual aids used to discuss items related to surgery.      Discussed post-op pain management in detail including the aspects of the enhanced recovery pathway.  Advised her that a new prescription would be sent in for tramadol and it is only to be used for after her upcoming surgery.  We discussed the use of tylenol post-op and to monitor for a maximum of 4,000 mg in a 24 hour period.  Also prescribed sennakot to be used after surgery and to hold if having loose stools.  Discussed bowel regimen in detail.     Discussed measures to take at home to prevent DVT including frequent mobility.  Reportable signs and symptoms of DVT discussed. Post-operative instructions discussed and expectations for after surgery. Incisional care discussed as well including reportable signs and symptoms including erythema, drainage, wound separation.     10 minutes spent with the patient.  Verbalizing understanding of material discussed. No needs or concerns voiced at the end of the visit.   Advised patient and family to call for any needs.  Advised that her post-operative medications had been prescribed and could be picked up at any time.    This appointment is included in the global surgical bundle as pre-operative teaching and has no charge.

## 2022-01-17 ENCOUNTER — Encounter (HOSPITAL_COMMUNITY): Payer: Self-pay

## 2022-01-17 ENCOUNTER — Other Ambulatory Visit: Payer: Self-pay

## 2022-01-17 ENCOUNTER — Telehealth: Payer: Self-pay | Admitting: Surgery

## 2022-01-17 ENCOUNTER — Encounter (HOSPITAL_COMMUNITY)
Admission: RE | Admit: 2022-01-17 | Discharge: 2022-01-17 | Disposition: A | Discharge status: Home / Self Care | Payer: Medicare HMO | Source: Ambulatory Visit | Attending: Psychiatry | Admitting: Psychiatry

## 2022-01-17 VITALS — BP 124/76 | HR 68 | Temp 98.7°F | Resp 20 | Ht 66.0 in | Wt 168.0 lb

## 2022-01-17 DIAGNOSIS — Z01818 Encounter for other preprocedural examination: Secondary | ICD-10-CM | POA: Diagnosis not present

## 2022-01-17 DIAGNOSIS — C541 Malignant neoplasm of endometrium: Secondary | ICD-10-CM | POA: Diagnosis not present

## 2022-01-17 DIAGNOSIS — I2510 Atherosclerotic heart disease of native coronary artery without angina pectoris: Secondary | ICD-10-CM | POA: Insufficient documentation

## 2022-01-17 HISTORY — DX: Malignant neoplasm of endometrium: C54.1

## 2022-01-17 LAB — CBC
HCT: 49 % — ABNORMAL HIGH (ref 36.0–46.0)
Hemoglobin: 14.9 g/dL (ref 12.0–15.0)
MCH: 26.9 pg (ref 26.0–34.0)
MCHC: 30.4 g/dL (ref 30.0–36.0)
MCV: 88.6 fL (ref 80.0–100.0)
Platelets: 272 10*3/uL (ref 150–400)
RBC: 5.53 MIL/uL — ABNORMAL HIGH (ref 3.87–5.11)
RDW: 14.1 % (ref 11.5–15.5)
WBC: 7.3 10*3/uL (ref 4.0–10.5)
nRBC: 0 % (ref 0.0–0.2)

## 2022-01-17 LAB — COMPREHENSIVE METABOLIC PANEL
ALT: 11 U/L (ref 0–44)
AST: 22 U/L (ref 15–41)
Albumin: 4.1 g/dL (ref 3.5–5.0)
Alkaline Phosphatase: 66 U/L (ref 38–126)
Anion gap: 6 (ref 5–15)
BUN: 17 mg/dL (ref 8–23)
CO2: 27 mmol/L (ref 22–32)
Calcium: 10.5 mg/dL — ABNORMAL HIGH (ref 8.9–10.3)
Chloride: 106 mmol/L (ref 98–111)
Creatinine, Ser: 0.73 mg/dL (ref 0.44–1.00)
GFR, Estimated: >60 mL/min (ref >60)
Glucose, Bld: 95 mg/dL (ref 70–99)
Potassium: 4.6 mmol/L (ref 3.5–5.1)
Sodium: 139 mmol/L (ref 135–145)
Total Bilirubin: 0.6 mg/dL (ref 0.3–1.2)
Total Protein: 7.8 g/dL (ref 6.5–8.1)

## 2022-01-17 NOTE — Telephone Encounter (Signed)
Telephone call to check on pre-operative status.  Patient compliant with pre-operative instructions.  Reinforced nothing to eat after midnight. Clear liquids until 5:45am. Patient to arrive at 6:45am. Verified that post-op medications have been sent to patient's preferred pharmacy.  No questions or concerns voiced.  Instructed to call for any needs.

## 2022-01-17 NOTE — Progress Notes (Addendum)
COVID Vaccine Completed:  Yes  Date of COVID positive in last 90 days:  No  PCP - Bernerd Limbo, MD Cardiologist - N/A  Chest x-ray -  CT chest 01-07-22 Epic EKG - 01-17-22 Epic Stress Test - N/A ECHO - N/A Cardiac Cath - N/A Pacemaker/ICD device last checked: Spinal Cord Stimulator: N/A  Bowel Prep - N/A  Sleep Study - N/A CPAP -   Fasting Blood Sugar - N/A Checks Blood Sugar _____ times a day  Last dose of GLP1 agonist-  N/A GLP1 instructions:  N/A   Last dose of SGLT-2 inhibitors-  N/A SGLT-2 instructions: N/A  Blood Thinner Instructions: Aspirin Instructions:  ASA 81.  Stopped one week ago Last Dose:  Activity level:  Can go up a flight of stairs and perform activities of daily living without stopping and without symptoms of chest pain or shortness of breath. Pt lives alone and is able to care for herself  Anesthesia review:  N/A  Patient denies shortness of breath, fever, cough and chest pain at PAT appointment  Patient verbalized understanding of instructions that were given to them at the PAT appointment. Patient was also instructed that they will need to review over the PAT instructions again at home before surgery.

## 2022-01-18 ENCOUNTER — Other Ambulatory Visit: Payer: Self-pay

## 2022-01-18 ENCOUNTER — Ambulatory Visit (HOSPITAL_BASED_OUTPATIENT_CLINIC_OR_DEPARTMENT_OTHER): Payer: Medicare HMO | Admitting: Anesthesiology

## 2022-01-18 ENCOUNTER — Ambulatory Visit (HOSPITAL_COMMUNITY)
Admission: RE | Admit: 2022-01-18 | Discharge: 2022-01-19 | Disposition: A | Discharge status: Home / Self Care | Payer: Medicare HMO | Attending: Psychiatry | Admitting: Psychiatry

## 2022-01-18 ENCOUNTER — Ambulatory Visit (HOSPITAL_COMMUNITY): Payer: Medicare HMO | Admitting: Anesthesiology

## 2022-01-18 ENCOUNTER — Encounter (HOSPITAL_COMMUNITY)
Admission: RE | Disposition: A | Discharge status: Home / Self Care | Payer: Self-pay | Source: Home / Self Care | Attending: Psychiatry

## 2022-01-18 ENCOUNTER — Encounter (HOSPITAL_COMMUNITY): Payer: Self-pay | Admitting: Psychiatry

## 2022-01-18 DIAGNOSIS — D251 Intramural leiomyoma of uterus: Secondary | ICD-10-CM | POA: Insufficient documentation

## 2022-01-18 DIAGNOSIS — C563 Malignant neoplasm of bilateral ovaries: Secondary | ICD-10-CM | POA: Insufficient documentation

## 2022-01-18 DIAGNOSIS — I2510 Atherosclerotic heart disease of native coronary artery without angina pectoris: Secondary | ICD-10-CM

## 2022-01-18 DIAGNOSIS — Z1502 Genetic susceptibility to malignant neoplasm of ovary: Secondary | ICD-10-CM | POA: Insufficient documentation

## 2022-01-18 DIAGNOSIS — C541 Malignant neoplasm of endometrium: Secondary | ICD-10-CM

## 2022-01-18 DIAGNOSIS — I10 Essential (primary) hypertension: Secondary | ICD-10-CM | POA: Insufficient documentation

## 2022-01-18 DIAGNOSIS — N950 Postmenopausal bleeding: Secondary | ICD-10-CM | POA: Insufficient documentation

## 2022-01-18 DIAGNOSIS — R112 Nausea with vomiting, unspecified: Secondary | ICD-10-CM

## 2022-01-18 DIAGNOSIS — Z1509 Genetic susceptibility to other malignant neoplasm: Secondary | ICD-10-CM | POA: Insufficient documentation

## 2022-01-18 HISTORY — PX: ROBOTIC ASSISTED TOTAL HYSTERECTOMY WITH BILATERAL SALPINGO OOPHERECTOMY: SHX6086

## 2022-01-18 HISTORY — PX: SENTINEL NODE BIOPSY: SHX6608

## 2022-01-18 HISTORY — PX: LYMPH NODE DISSECTION: SHX5087

## 2022-01-18 LAB — TYPE AND SCREEN
ABO/RH(D): A POS
Antibody Screen: NEGATIVE

## 2022-01-18 LAB — CA 125: Cancer Antigen (CA) 125: 15.7 U/mL (ref 0.0–38.1)

## 2022-01-18 SURGERY — HYSTERECTOMY, TOTAL, ROBOT-ASSISTED, LAPAROSCOPIC, WITH BILATERAL SALPINGO-OOPHORECTOMY
Anesthesia: General

## 2022-01-18 MED ORDER — ONDANSETRON HCL 4 MG/2ML IJ SOLN
INTRAMUSCULAR | Status: AC
  Administered 2022-01-18: 4 mg
  Filled 2022-01-18: qty 2

## 2022-01-18 MED ORDER — ROCURONIUM BROMIDE 10 MG/ML (PF) SYRINGE
PREFILLED_SYRINGE | INTRAVENOUS | Status: AC
  Filled 2022-01-18: qty 10

## 2022-01-18 MED ORDER — DEXAMETHASONE SODIUM PHOSPHATE 10 MG/ML IJ SOLN
INTRAMUSCULAR | Status: DC | PRN
  Administered 2022-01-18: 4 mg via INTRAVENOUS

## 2022-01-18 MED ORDER — BUPIVACAINE HCL 0.25 % IJ SOLN
INTRAMUSCULAR | Status: DC | PRN
  Administered 2022-01-18: 31 mL

## 2022-01-18 MED ORDER — PHENYLEPHRINE HCL (PRESSORS) 10 MG/ML IV SOLN
INTRAVENOUS | Status: AC
  Filled 2022-01-18: qty 1

## 2022-01-18 MED ORDER — LIDOCAINE HCL (PF) 2 % IJ SOLN
INTRAMUSCULAR | Status: AC
  Filled 2022-01-18: qty 5

## 2022-01-18 MED ORDER — DEXAMETHASONE SODIUM PHOSPHATE 10 MG/ML IJ SOLN
INTRAMUSCULAR | Status: AC
  Filled 2022-01-18: qty 1

## 2022-01-18 MED ORDER — STERILE WATER FOR INJECTION IJ SOLN
INTRAMUSCULAR | Status: AC
  Filled 2022-01-18: qty 10

## 2022-01-18 MED ORDER — HEPARIN SODIUM (PORCINE) 5000 UNIT/ML IJ SOLN
5000.0000 [IU] | INTRAMUSCULAR | Status: AC
  Administered 2022-01-18: 5000 [IU] via SUBCUTANEOUS
  Filled 2022-01-18: qty 1

## 2022-01-18 MED ORDER — ACETAMINOPHEN 500 MG PO TABS
1000.0000 mg | ORAL_TABLET | Freq: Two times a day (BID) | ORAL | Status: DC
  Administered 2022-01-18 – 2022-01-19 (×2): 1000 mg via ORAL
  Filled 2022-01-18 (×2): qty 2

## 2022-01-18 MED ORDER — ONDANSETRON HCL 4 MG/2ML IJ SOLN: 4.0000 mg | Freq: Once | INTRAMUSCULAR | Status: DC | PRN

## 2022-01-18 MED ORDER — OXYCODONE HCL 5 MG PO TABS
5.0000 mg | ORAL_TABLET | ORAL | Status: DC | PRN
  Administered 2022-01-19: 5 mg via ORAL
  Filled 2022-01-18: qty 1

## 2022-01-18 MED ORDER — CEFAZOLIN SODIUM-DEXTROSE 2-4 GM/100ML-% IV SOLN
2.0000 g | INTRAVENOUS | Status: AC
  Administered 2022-01-18: 2 g via INTRAVENOUS
  Filled 2022-01-18: qty 100

## 2022-01-18 MED ORDER — PHENYLEPHRINE HCL-NACL 20-0.9 MG/250ML-% IV SOLN
INTRAVENOUS | Status: DC | PRN
  Administered 2022-01-18: 30 ug/min via INTRAVENOUS

## 2022-01-18 MED ORDER — FENTANYL CITRATE (PF) 250 MCG/5ML IJ SOLN
INTRAMUSCULAR | Status: DC | PRN
  Administered 2022-01-18: 50 ug via INTRAVENOUS
  Administered 2022-01-18 (×3): 25 ug via INTRAVENOUS
  Administered 2022-01-18: 50 ug via INTRAVENOUS
  Administered 2022-01-18: 25 ug via INTRAVENOUS

## 2022-01-18 MED ORDER — PROCHLORPERAZINE EDISYLATE 10 MG/2ML IJ SOLN: 10.0000 mg | Freq: Four times a day (QID) | INTRAMUSCULAR | Status: DC | PRN

## 2022-01-18 MED ORDER — LABETALOL HCL 5 MG/ML IV SOLN
INTRAVENOUS | Status: DC | PRN
  Administered 2022-01-18: 2.5 mg via INTRAVENOUS
  Administered 2022-01-18: 5 mg via INTRAVENOUS

## 2022-01-18 MED ORDER — STERILE WATER FOR INJECTION IJ SOLN
INTRAMUSCULAR | Status: DC | PRN
  Administered 2022-01-18: 4 mL

## 2022-01-18 MED ORDER — DEXMEDETOMIDINE HCL IN NACL 80 MCG/20ML IV SOLN
INTRAVENOUS | Status: DC | PRN
  Administered 2022-01-18: 4 ug via BUCCAL

## 2022-01-18 MED ORDER — SUGAMMADEX SODIUM 200 MG/2ML IV SOLN
INTRAVENOUS | Status: DC | PRN
  Administered 2022-01-18: 200 mg via INTRAVENOUS

## 2022-01-18 MED ORDER — CHLORHEXIDINE GLUCONATE 0.12 % MT SOLN
15.0000 mL | Freq: Once | OROMUCOSAL | Status: AC
  Administered 2022-01-18: 15 mL via OROMUCOSAL

## 2022-01-18 MED ORDER — LABETALOL HCL 5 MG/ML IV SOLN
INTRAVENOUS | Status: AC
  Filled 2022-01-18: qty 4

## 2022-01-18 MED ORDER — LIDOCAINE HCL (PF) 2 % IJ SOLN
INTRAMUSCULAR | Status: DC | PRN
  Administered 2022-01-18: 1.5 mg/kg/h via INTRADERMAL

## 2022-01-18 MED ORDER — AMLODIPINE BESYLATE 5 MG PO TABS
5.0000 mg | ORAL_TABLET | Freq: Every day | ORAL | Status: DC
  Administered 2022-01-19: 5 mg via ORAL
  Filled 2022-01-18: qty 1

## 2022-01-18 MED ORDER — PROPOFOL 10 MG/ML IV BOLUS
INTRAVENOUS | Status: DC | PRN
  Administered 2022-01-18: 100 mg via INTRAVENOUS

## 2022-01-18 MED ORDER — KCL IN DEXTROSE-NACL 20-5-0.45 MEQ/L-%-% IV SOLN
INTRAVENOUS | Status: DC
  Filled 2022-01-18 (×2): qty 1000

## 2022-01-18 MED ORDER — ONDANSETRON HCL 4 MG PO TABS: 4.0000 mg | ORAL_TABLET | Freq: Four times a day (QID) | ORAL | Status: DC | PRN

## 2022-01-18 MED ORDER — PROPOFOL 10 MG/ML IV BOLUS
INTRAVENOUS | Status: AC
  Filled 2022-01-18: qty 20

## 2022-01-18 MED ORDER — LACTATED RINGERS IR SOLN
Status: DC | PRN
  Administered 2022-01-18: 1000 mL

## 2022-01-18 MED ORDER — ROCURONIUM BROMIDE 100 MG/10ML IV SOLN
INTRAVENOUS | Status: DC | PRN
  Administered 2022-01-18: 100 mg via INTRAVENOUS
  Administered 2022-01-18 (×2): 10 mg via INTRAVENOUS

## 2022-01-18 MED ORDER — HEMOSTATIC AGENTS (NO CHARGE) OPTIME
TOPICAL | Status: DC | PRN
  Administered 2022-01-18: 1 via TOPICAL

## 2022-01-18 MED ORDER — LACTATED RINGERS IV SOLN: INTRAVENOUS | Status: DC | PRN

## 2022-01-18 MED ORDER — HYDROMORPHONE HCL 1 MG/ML IJ SOLN: 0.2500 mg | INTRAMUSCULAR | Status: DC | PRN

## 2022-01-18 MED ORDER — OXYCODONE HCL 5 MG/5ML PO SOLN: 5.0000 mg | Freq: Once | ORAL | Status: DC | PRN

## 2022-01-18 MED ORDER — ACETAMINOPHEN 500 MG PO TABS: 1000.0000 mg | ORAL_TABLET | Freq: Once | ORAL | Status: DC

## 2022-01-18 MED ORDER — STERILE WATER FOR IRRIGATION IR SOLN
Status: DC | PRN
  Administered 2022-01-18: 1000 mL

## 2022-01-18 MED ORDER — MECLIZINE HCL 25 MG PO TABS
25.0000 mg | ORAL_TABLET | Freq: Two times a day (BID) | ORAL | Status: DC | PRN
  Administered 2022-01-18: 25 mg via ORAL
  Filled 2022-01-18: qty 1

## 2022-01-18 MED ORDER — LACTATED RINGERS IV SOLN: INTRAVENOUS | Status: DC

## 2022-01-18 MED ORDER — SIMVASTATIN 20 MG PO TABS
20.0000 mg | ORAL_TABLET | Freq: Every day | ORAL | Status: DC
  Administered 2022-01-18: 20 mg via ORAL
  Filled 2022-01-18: qty 1

## 2022-01-18 MED ORDER — ACETAMINOPHEN 500 MG PO TABS
1000.0000 mg | ORAL_TABLET | ORAL | Status: DC
  Filled 2022-01-18: qty 2

## 2022-01-18 MED ORDER — ONDANSETRON HCL 4 MG/2ML IJ SOLN
INTRAMUSCULAR | Status: DC | PRN
  Administered 2022-01-18: 4 mg via INTRAVENOUS

## 2022-01-18 MED ORDER — ENOXAPARIN SODIUM 40 MG/0.4ML IJ SOSY
40.0000 mg | PREFILLED_SYRINGE | INTRAMUSCULAR | Status: DC
  Administered 2022-01-19: 40 mg via SUBCUTANEOUS
  Filled 2022-01-18: qty 0.4

## 2022-01-18 MED ORDER — FENTANYL CITRATE (PF) 250 MCG/5ML IJ SOLN
INTRAMUSCULAR | Status: AC
  Filled 2022-01-18: qty 5

## 2022-01-18 MED ORDER — TRAMADOL HCL 50 MG PO TABS: 100.0000 mg | ORAL_TABLET | Freq: Two times a day (BID) | ORAL | Status: DC | PRN

## 2022-01-18 MED ORDER — SENNOSIDES-DOCUSATE SODIUM 8.6-50 MG PO TABS
2.0000 | ORAL_TABLET | Freq: Every day | ORAL | Status: DC
  Administered 2022-01-18: 2 via ORAL
  Filled 2022-01-18: qty 2

## 2022-01-18 MED ORDER — ONDANSETRON HCL 4 MG/2ML IJ SOLN
INTRAMUSCULAR | Status: AC
  Filled 2022-01-18: qty 2

## 2022-01-18 MED ORDER — PROCHLORPERAZINE MALEATE 10 MG PO TABS: 10.0000 mg | ORAL_TABLET | Freq: Four times a day (QID) | ORAL | Status: DC | PRN

## 2022-01-18 MED ORDER — BUPIVACAINE HCL 0.25 % IJ SOLN
INTRAMUSCULAR | Status: AC
  Filled 2022-01-18: qty 1

## 2022-01-18 MED ORDER — AMISULPRIDE (ANTIEMETIC) 5 MG/2ML IV SOLN: 10.0000 mg | Freq: Once | INTRAVENOUS | Status: DC | PRN

## 2022-01-18 MED ORDER — HYDROMORPHONE HCL 1 MG/ML IJ SOLN
0.5000 mg | INTRAMUSCULAR | Status: DC | PRN
  Administered 2022-01-18: 0.5 mg via INTRAVENOUS
  Filled 2022-01-18: qty 0.5

## 2022-01-18 MED ORDER — OXYCODONE HCL 5 MG PO TABS: 5.0000 mg | ORAL_TABLET | Freq: Once | ORAL | Status: DC | PRN

## 2022-01-18 MED ORDER — LIDOCAINE HCL (CARDIAC) PF 100 MG/5ML IV SOSY
PREFILLED_SYRINGE | INTRAVENOUS | Status: DC | PRN
  Administered 2022-01-18: 60 mg via INTRAVENOUS

## 2022-01-18 MED ORDER — STERILE WATER FOR INJECTION IJ SOLN
INTRAMUSCULAR | Status: DC | PRN
  Administered 2022-01-18: 10 mL

## 2022-01-18 MED ORDER — ORAL CARE MOUTH RINSE: 15.0000 mL | Freq: Once | OROMUCOSAL | Status: AC

## 2022-01-18 MED ORDER — ONDANSETRON HCL 4 MG/2ML IJ SOLN
4.0000 mg | Freq: Four times a day (QID) | INTRAMUSCULAR | Status: DC | PRN
  Administered 2022-01-18: 4 mg via INTRAVENOUS

## 2022-01-18 MED ORDER — DEXAMETHASONE SODIUM PHOSPHATE 4 MG/ML IJ SOLN: 4.0000 mg | INTRAMUSCULAR | Status: DC

## 2022-01-18 SURGICAL SUPPLY — 79 items
ADH SKN CLS APL DERMABOND .7 (GAUZE/BANDAGES/DRESSINGS) ×2
AGENT HMST KT MTR STRL THRMB (HEMOSTASIS) ×2
APL ESCP 34 STRL LF DISP (HEMOSTASIS) ×2
APPLICATOR SURGIFLO ENDO (HEMOSTASIS) IMPLANT
BAG LAPAROSCOPIC 12 15 PORT 16 (BASKET) IMPLANT
BAG RETRIEVAL 12/15 (BASKET)
BLADE SURG SZ10 CARB STEEL (BLADE) IMPLANT
COVER BACK TABLE 60X90IN (DRAPES) ×2 IMPLANT
COVER TIP SHEARS 8 DVNC (MISCELLANEOUS) ×2 IMPLANT
COVER TIP SHEARS 8MM DA VINCI (MISCELLANEOUS) ×2
DERMABOND ADVANCED .7 DNX12 (GAUZE/BANDAGES/DRESSINGS) ×2 IMPLANT
DRAPE ARM DVNC X/XI (DISPOSABLE) ×8 IMPLANT
DRAPE COLUMN DVNC XI (DISPOSABLE) ×2 IMPLANT
DRAPE DA VINCI XI ARM (DISPOSABLE) ×8
DRAPE DA VINCI XI COLUMN (DISPOSABLE) ×2
DRAPE SHEET LG 3/4 BI-LAMINATE (DRAPES) ×2 IMPLANT
DRAPE SURG IRRIG POUCH 19X23 (DRAPES) ×2 IMPLANT
DRSG OPSITE POSTOP 4X6 (GAUZE/BANDAGES/DRESSINGS) IMPLANT
DRSG OPSITE POSTOP 4X8 (GAUZE/BANDAGES/DRESSINGS) IMPLANT
ELECT PENCIL ROCKER SW 15FT (MISCELLANEOUS) IMPLANT
ELECT REM PT RETURN 15FT ADLT (MISCELLANEOUS) ×2 IMPLANT
GAUZE 4X4 16PLY ~~LOC~~+RFID DBL (SPONGE) ×2 IMPLANT
GLOVE BIO SURGEON STRL SZ 6 (GLOVE) ×8 IMPLANT
GLOVE BIO SURGEON STRL SZ 6.5 (GLOVE) ×2 IMPLANT
GLOVE BIOGEL PI IND STRL 6.5 (GLOVE) ×4 IMPLANT
GOWN STRL REUS W/ TWL LRG LVL3 (GOWN DISPOSABLE) ×8 IMPLANT
GOWN STRL REUS W/TWL LRG LVL3 (GOWN DISPOSABLE) ×10
GRASPER SUT TROCAR 14GX15 (MISCELLANEOUS) IMPLANT
HOLDER FOLEY CATH W/STRAP (MISCELLANEOUS) IMPLANT
IRRIG SUCT STRYKERFLOW 2 WTIP (MISCELLANEOUS) ×2
IRRIGATION SUCT STRKRFLW 2 WTP (MISCELLANEOUS) ×2 IMPLANT
KIT PROCEDURE DA VINCI SI (MISCELLANEOUS) ×2
KIT PROCEDURE DVNC SI (MISCELLANEOUS) IMPLANT
KIT TURNOVER KIT A (KITS) IMPLANT
LIGASURE IMPACT 36 18CM CVD LR (INSTRUMENTS) IMPLANT
MANIPULATOR ADVINCU DEL 3.0 PL (MISCELLANEOUS) IMPLANT
MANIPULATOR ADVINCU DEL 3.5 PL (MISCELLANEOUS) IMPLANT
MANIPULATOR UTERINE 4.5 ZUMI (MISCELLANEOUS) IMPLANT
NDL HYPO 21X1.5 SAFETY (NEEDLE) ×2 IMPLANT
NDL INSUFFLATION 14GA 120MM (NEEDLE) IMPLANT
NDL SPNL 18GX3.5 QUINCKE PK (NEEDLE) IMPLANT
NEEDLE HYPO 21X1.5 SAFETY (NEEDLE) ×2 IMPLANT
NEEDLE INSUFFLATION 14GA 120MM (NEEDLE) IMPLANT
NEEDLE SPNL 18GX3.5 QUINCKE PK (NEEDLE) ×2 IMPLANT
OBTURATOR OPTICAL STANDARD 8MM (TROCAR) ×2
OBTURATOR OPTICAL STND 8 DVNC (TROCAR) ×2
OBTURATOR OPTICALSTD 8 DVNC (TROCAR) ×2 IMPLANT
PACK ROBOT GYN CUSTOM WL (TRAY / TRAY PROCEDURE) ×2 IMPLANT
PAD ARMBOARD 7.5X6 YLW CONV (MISCELLANEOUS) ×2 IMPLANT
PAD POSITIONING PINK XL (MISCELLANEOUS) ×2 IMPLANT
PORT ACCESS TROCAR AIRSEAL 12 (TROCAR) IMPLANT
SEAL CANN UNIV 5-8 DVNC XI (MISCELLANEOUS) ×8 IMPLANT
SEAL XI 5MM-8MM UNIVERSAL (MISCELLANEOUS) ×8
SET TRI-LUMEN FLTR TB AIRSEAL (TUBING) ×2 IMPLANT
SOL PREP POV-IOD 4OZ 10% (MISCELLANEOUS) ×4 IMPLANT
SPIKE FLUID TRANSFER (MISCELLANEOUS) ×2 IMPLANT
SPONGE T-LAP 18X18 ~~LOC~~+RFID (SPONGE) IMPLANT
SURGIFLO W/THROMBIN 8M KIT (HEMOSTASIS) IMPLANT
SUT MNCRL AB 4-0 PS2 18 (SUTURE) IMPLANT
SUT PDS AB 1 TP1 96 (SUTURE) IMPLANT
SUT VIC AB 0 CT1 27 (SUTURE)
SUT VIC AB 0 CT1 27XBRD ANTBC (SUTURE) IMPLANT
SUT VIC AB 2-0 CT1 27 (SUTURE)
SUT VIC AB 2-0 CT1 TAPERPNT 27 (SUTURE) IMPLANT
SUT VIC AB 4-0 PS2 18 (SUTURE) ×4 IMPLANT
SUT VLOC 180 0 9IN  GS21 (SUTURE)
SUT VLOC 180 0 9IN GS21 (SUTURE) IMPLANT
SYR 10ML LL (SYRINGE) IMPLANT
SYS BAG RETRIEVAL 10MM (BASKET)
SYS WOUND ALEXIS 18CM MED (MISCELLANEOUS)
SYSTEM BAG RETRIEVAL 10MM (BASKET) IMPLANT
SYSTEM WOUND ALEXIS 18CM MED (MISCELLANEOUS) IMPLANT
TOWEL OR NON WOVEN STRL DISP B (DISPOSABLE) IMPLANT
TRAP SPECIMEN MUCUS 40CC (MISCELLANEOUS) IMPLANT
TRAY FOLEY MTR SLVR 16FR STAT (SET/KITS/TRAYS/PACK) ×2 IMPLANT
TROCAR PORT AIRSEAL 5X120 (TROCAR) IMPLANT
UNDERPAD 30X36 HEAVY ABSORB (UNDERPADS AND DIAPERS) ×4 IMPLANT
WATER STERILE IRR 1000ML POUR (IV SOLUTION) ×2 IMPLANT
YANKAUER SUCT BULB TIP 10FT TU (MISCELLANEOUS) IMPLANT

## 2022-01-18 NOTE — Op Note (Addendum)
GYNECOLOGIC ONCOLOGY OPERATIVE NOTE  Date of Service: 01/18/2022  Preoperative Diagnosis: Grade 3 endometrial cancer, p53 mutated, like serous  Postoperative Diagnosis: Same  Procedures: Robotic-assisted total laparoscopic hysterectomy, bilateral salpingo-oophorectomy, bilateral sentinel lymph node evaluation and biopsy  Surgeon: Bernadene Bell, MD  Assistants: Lahoma Crocker, MD  Anesthesia: General  Estimated Blood Loss: 50 mL  Fluids: 10136m, crystalloid  Urine Output: 80136m clear yellow  Findings: Normal upper abdominal survey with normal liver surface and diaphragm. Normal appearing small and large bowel. Ascending colon at the hepatic flexure adherent to the lateral abdominal wall.  Appendix visualized and adherent to the right lower quadrant.  Filmy adhesions of the rectosigmoid colon to the left lower abdominal wall, left pelvic sidewall, and cul-de-sac. Extensive diverticulosis present. Few vesicular lesions on the peritoneum of the posterior cul-de-sac that were removed en bloc with the uterine specimen, possible endosalpingosis.  Subcentimeter fibrotic appearing lesion on the left uterosacral ligament, excised.  Globular uterus. Bilateral fallopian tubes adherent to the ovarian fossas with left hydrosalpinx. Normal bilateral ovaries. Sentinel mapping on right to the obturator space; sentinel mapping on left to the obturator space.   Specimens:  ID Type Source Tests Collected by Time Destination  1 : Left obturator sentinel lymph node Tissue PATH Sentinel Lymph Node SURGICAL PATHOLOGY NeBernadene BellMD 01/18/2022 1100   2 : RIght obturator sentinel lymph node Tissue PATH Sentinel Lymph Node SURGICAL PATHOLOGY NeBernadene BellMD 01/18/2022 1101   3 : Left uterosacral lymph node Tissue PATH Lymph node biopsy SURGICAL PATHOLOGY NeBernadene BellMD 01/18/2022 1157   4 : Uterus, cervix, bilateral tubes  and ovaries Tissue PATH Gyn tumor resection SURGICAL PATHOLOGY  NeBernadene BellMD [id number]   Complications:  None  Indications for Procedure: April LUFFs a [age].o. 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  Prior to the procedure, all risks, benefits, and alternatives were discussed and informed surgical consent was signed.  Procedure: Patient was taken to the operating room where general anesthesia was achieved.  She was positioned in dorsal lithotomy and prepped and draped.  A foley catheter was inserted into the bladder. 1 ml of dilute Indo-Cyanine dye was was injected at 1cm and 36m53meep at 3 and 9 o'clock in the cervical stroma.  The cervix was dilated and an Advincula uterine manipulator with a colpotomy ring was inserted into the uterus.  A 10 mm incision was made in the left upper quadrant near Palmer's point.  The abdomen was entered with a 5 mm OptiView trocar under direct visualization.  The abdomen was insufflated, the patient placed in steep Trendelenburg, and additional trocars were placed as follows: an 8mm57mocar superior to the umbilicus, two 8 mm robotic trocars in the right abdomen, and one 8 mm robotic trocar in the left abdomen.  The left upper quadrant trocar was removed and replaced with a 12 mm airseal trocar.  All trocars were placed under direct visualization.  The bowels were moved into the upper abdomen.  The DaVinci robotic surgical system was brought to the patient's bedside and docked.  The right round ligament was transected and the retroperitoneum entered.The right ureter was identified. The paravesical and pararectal spaces were opened, and the node was found to be located in the right obturator space. A sentinel lymph node dissection was performed taking care to avoid injury to the ureter, superior vesicle artery or obturator nerve. A similar procedure was performed on left with  a sentinel lymph node again identified in the obturator space. The sentinel lymph nodes  mentioned above were identified and removed through the assistant trocar.  Adhesions of the rectosigmoid colon to the left pelvic sidewall and posterior cul-de-sac were lysed sharply with scissors.  The left ureter was again identified, and the left infundibulopelvic ligament was isolated, cauterized, and transected. The posterior peritoneum was opened to the colpotomy ring. The anterior peritoneum was opened and the bladder flap was created. The left uterine artery was skeletonized, cauterized, and transected at the level of the colpotomy ring. Additional cautery was used in a C-shaped fashion to allow the remainder of the broad, cardinal, and uterosacral ligaments with the uterine vessels to be transected and fall away from the colpotomy ring.  A similar procedure was performed on the contralateral side. A colpotomy was made circumferentially following the contours of the colpotomy ring.  The uterine specimen was removed through the vagina.   The vaginal cuff was closed with a running stitch of 0 Vicryl suture.  The pelvis was irrigated.  Surgiflo was applied to the bilateral nodal dissections and areas of adhesiolysis on the posterior cul-de-sac.  All operative sites were found to be hemostatic.  All instruments were removed and the robot was taken from the patient's bedside. The fascia at the 12 mm incision was closed with 0 Vicryl with the assistance of a PMI device. The abdomen was desufflated and all ports were removed.   The skin at all incisions was closed with 4-0 Vicryl to reapproximate the subcutaneous tissue and 4-0 monocryl in a subcuticular fashion followed by surgical glue.  Patient tolerated the procedure well. Sponge, lap, and instrument counts were correct.  Patient received 2 gm of Ancef prior to skin incision for routine perioperative antibiotic prophylaxis.  She was extubated and taken to the PACU in stable condition.  Bernadene Bell, MD Gynecologic Oncology

## 2022-01-18 NOTE — Transfer of Care (Signed)
Immediate Anesthesia Transfer of Care Note  Patient: April Lane  Procedure(s) Performed: XI ROBOTIC ASSISTED TOTAL HYSTERECTOMY WITH BILATERAL SALPINGO OOPHORECTOMY,POSSIBLE LAPARATOMY (Bilateral) SENTINEL NODE BIOPSY LYMPH NODE DISSECTION  Patient Location: PACU  Anesthesia Type:General  Level of Consciousness: awake, alert , oriented, and patient cooperative  Airway & Oxygen Therapy: Patient Spontanous Breathing and Patient connected to face mask oxygen  Post-op Assessment: Report given to RN and Post -op Vital signs reviewed and stable  Post vital signs: Reviewed and stable  Last Vitals:  Vitals Value Taken Time  BP 115/68 01/18/22 1258  Temp    Pulse 68 01/18/22 1258  Resp 19 01/18/22 1258  SpO2 100 % 01/18/22 1258  Vitals shown include unvalidated device data.  Last Pain:  Vitals:   01/18/22 0733  TempSrc:   PainSc: 0-No pain         Complications: No notable events documented.

## 2022-01-18 NOTE — Interval H&P Note (Signed)
History and Physical Interval Note:  01/18/2022 7:26 AM  April Lane  has presented today for surgery, with the diagnosis of ENDOMETRIAL CANCER.  The various methods of treatment have been discussed with the patient and family. After consideration of risks, benefits and other options for treatment, the patient has consented to  Procedure(s): XI ROBOTIC ASSISTED TOTAL HYSTERECTOMY WITH BILATERAL SALPINGO OOPHORECTOMY,POSSIBLE LAPARATOMY (Bilateral) SENTINEL NODE BIOPSY (N/A) POSSIBLE LYMPH NODE DISSECTION (N/A) as a surgical intervention.  The patient's history has been reviewed, patient examined, no change in status, stable for surgery.  I have reviewed the patient's chart and labs.  Questions were answered to the patient's satisfaction.     Gen Clagg

## 2022-01-18 NOTE — Anesthesia Procedure Notes (Signed)
Procedure Name: Intubation Date/Time: 01/18/2022 9:37 AM  Performed by: Garrel Ridgel, CRNAPre-anesthesia Checklist: Patient identified, Emergency Drugs available, Suction available and Patient being monitored Patient Re-evaluated:Patient Re-evaluated prior to induction Oxygen Delivery Method: Circle system utilized Preoxygenation: Pre-oxygenation with 100% oxygen Induction Type: IV induction Ventilation: Mask ventilation without difficulty Laryngoscope Size: Mac and 3 Grade View: Grade I Tube type: Oral Tube size: 7.0 mm Number of attempts: 1 Airway Equipment and Method: Stylet and Oral airway Placement Confirmation: ETT inserted through vocal cords under direct vision, positive ETCO2 and breath sounds checked- equal and bilateral Secured at: 22 cm Tube secured with: Tape Dental Injury: Teeth and Oropharynx as per pre-operative assessment

## 2022-01-18 NOTE — Discharge Instructions (Signed)
AFTER SURGERY INSTRUCTIONS   Return to work: 4-6 weeks if applicable  Restart aspirin 81 mg 3 days after surgery.   Activity: 1. Be up and out of the bed during the day.  Take a nap if needed.  You may walk up steps but be careful and use the hand rail.  Stair climbing will tire you more than you think, you may need to stop part way and rest.    2. No lifting or straining for 6 weeks over 10 pounds. No pushing, pulling, straining for 6 weeks.   3. No driving for around 1 week(s).  Do not drive if you are taking narcotic pain medicine and make sure that your reaction time has returned.    4. You can shower as soon as the next day after surgery. Shower daily.  Use your regular soap and water (not directly on the incision) and pat your incision(s) dry afterwards; don't rub.  No tub baths or submerging your body in water until cleared by your surgeon. If you have the soap that was given to you by pre-surgical testing that was used before surgery, you do not need to use it afterwards because this can irritate your incisions.    5. No sexual activity and nothing in the vagina for 8-10 weeks.   6. You may experience a small amount of clear drainage from your incisions, which is normal.  If the drainage persists, increases, or changes color please call the office.   7. Do not use creams, lotions, or ointments such as neosporin on your incisions after surgery until advised by your surgeon because they can cause removal of the dermabond glue on your incisions.     8. You may experience vaginal spotting after surgery or around the 6-8 week mark from surgery when the stitches at the top of the vagina begin to dissolve.  The spotting is normal but if you experience heavy bleeding, call our office.   9. Take Tylenol or ibuprofen first for pain if you are able to take these medications and only use narcotic pain medication for severe pain not relieved by the Tylenol or Ibuprofen.  Monitor your Tylenol intake  to a max of 4,000 mg in a 24 hour period. You can alternate these medications after surgery.   Diet: 1. Low sodium Heart Healthy Diet is recommended but you are cleared to resume your normal (before surgery) diet after your procedure.   2. It is safe to use a laxative, such as Miralax or Colace, if you have difficulty moving your bowels. You have been prescribed Sennakot-S to take at bedtime every evening after surgery to keep bowel movements regular and to prevent constipation.     Wound Care: 1. Keep clean and dry.  Shower daily.   Reasons to call the Doctor: Fever - Oral temperature greater than 100.4 degrees Fahrenheit Foul-smelling vaginal discharge Difficulty urinating Nausea and vomiting Increased pain at the site of the incision that is unrelieved with pain medicine. Difficulty breathing with or without chest pain New calf pain especially if only on one side Sudden, continuing increased vaginal bleeding with or without clots.   Contacts: For questions or concerns you should contact:   Dr. Bernadene Bell at [place], NP at [phone]   After Hours: call [phone] and have the GYN Oncologist paged/contacted (after 5 pm or on the weekends).   Messages sent via mychart are for non-urgent matters and are not responded to after hours so  for urgent needs, please call the after hours number.

## 2022-01-18 NOTE — Anesthesia Postprocedure Evaluation (Signed)
Anesthesia Post Note  Patient: April Lane  Procedure(s) Performed: XI ROBOTIC ASSISTED TOTAL HYSTERECTOMY WITH BILATERAL SALPINGO OOPHORECTOMY,POSSIBLE LAPARATOMY (Bilateral) SENTINEL NODE BIOPSY LYMPH NODE DISSECTION     Patient location during evaluation: PACU Anesthesia Type: General Level of consciousness: awake and alert, oriented and patient cooperative Pain management: pain level controlled Vital Signs Assessment: post-procedure vital signs reviewed and stable Respiratory status: spontaneous breathing, nonlabored ventilation and respiratory function stable Cardiovascular status: blood pressure returned to baseline and stable Postop Assessment: no apparent nausea or vomiting Anesthetic complications: no   No notable events documented.  Last Vitals:  Vitals:   01/18/22 1400 01/18/22 1415  BP: 126/73 126/75  Pulse: 69 70  Resp: 16 14  Temp:    SpO2: 97% 97%    Last Pain:  Vitals:   01/18/22 1330  TempSrc:   PainSc: [place]

## 2022-01-18 NOTE — Anesthesia Preprocedure Evaluation (Addendum)
Anesthesia Evaluation  Patient identified by MRN, date of birth, ID band Patient awake    Reviewed: Allergy & Precautions, NPO status , Patient's Chart, lab work & pertinent test results  Airway Mallampati: I  TM Distance: >3 FB Neck ROM: Full    Dental  (+) Edentulous Upper, Edentulous Lower   Pulmonary neg pulmonary ROS   Pulmonary exam normal breath sounds clear to auscultation       Cardiovascular hypertension (121/70 preop), Pt. on medications Normal cardiovascular exam Rhythm:Regular Rate:Normal     Neuro/Psych negative neurological ROS  negative psych ROS   GI/Hepatic Neg liver ROS, PUD,,,  Endo/Other  negative endocrine ROS    Renal/GU negative Renal ROS  negative genitourinary   Musculoskeletal  (+) Arthritis , Osteoarthritis,    Abdominal   Peds  Hematology negative hematology ROS (+) Hb 14.9, plt 272   Anesthesia Other Findings   Reproductive/Obstetrics Endometrial ca                             Anesthesia Physical Anesthesia Plan  ASA: 2  Anesthesia Plan: General   Post-op Pain Management: Tylenol PO (pre-op)*   Induction: Intravenous  PONV Risk Score and Plan: 4 or greater and Ondansetron, Dexamethasone and Treatment may vary due to age or medical condition  Airway Management Planned: Oral ETT  Additional Equipment: None  Intra-op Plan:   Post-operative Plan: Extubation in OR  Informed Consent: I have reviewed the patients History and Physical, chart, labs and discussed the procedure including the risks, benefits and alternatives for the proposed anesthesia with the patient or authorized representative who has indicated his/her understanding and acceptance.     Dental advisory given  Plan Discussed with: CRNA  Anesthesia Plan Comments:        Anesthesia Quick Evaluation

## 2022-01-19 ENCOUNTER — Telehealth: Payer: Self-pay | Admitting: *Deleted

## 2022-01-19 ENCOUNTER — Encounter (HOSPITAL_COMMUNITY): Payer: Self-pay | Admitting: Psychiatry

## 2022-01-19 DIAGNOSIS — C541 Malignant neoplasm of endometrium: Secondary | ICD-10-CM | POA: Diagnosis not present

## 2022-01-19 LAB — BASIC METABOLIC PANEL
Anion gap: 6 (ref 5–15)
BUN: 14 mg/dL (ref 8–23)
CO2: 24 mmol/L (ref 22–32)
Calcium: 9.2 mg/dL (ref 8.9–10.3)
Chloride: 106 mmol/L (ref 98–111)
Creatinine, Ser: 0.76 mg/dL (ref 0.44–1.00)
GFR, Estimated: >60 mL/min (ref >60)
Glucose, Bld: 142 mg/dL — ABNORMAL HIGH (ref 70–99)
Potassium: 4.8 mmol/L (ref 3.5–5.1)
Sodium: 136 mmol/L (ref 135–145)

## 2022-01-19 LAB — CBC
HCT: 43.9 % (ref 36.0–46.0)
Hemoglobin: 13.5 g/dL (ref 12.0–15.0)
MCH: 27.2 pg (ref 26.0–34.0)
MCHC: 30.8 g/dL (ref 30.0–36.0)
MCV: 88.3 fL (ref 80.0–100.0)
Platelets: 231 10*3/uL (ref 150–400)
RBC: 4.97 MIL/uL (ref 3.87–5.11)
RDW: 14.1 % (ref 11.5–15.5)
WBC: 11.6 10*3/uL — ABNORMAL HIGH (ref 4.0–10.5)
nRBC: 0 % (ref 0.0–0.2)

## 2022-01-19 NOTE — Progress Notes (Signed)
Discharge instructions given to patient and all questions were answered.  

## 2022-01-19 NOTE — TOC Transition Note (Signed)
Transition of Care [facility]) - CM/SW Discharge Note   Patient Details  Name: April Lane MRN: [id number] Date of Birth: February 16, 1940  Transition of Care [facility]) CM/SW Contact:  Lennart Pall, LCSW Phone Number: 01/19/2022, 1:36 PM   Clinical Narrative:    Order placed for TOC to order RW for pt's home use.  Pt aware and agreeable and has no DME agency preference.  Order placed with [place] for delivery to room today.  No further TOC needs.   Final next level of care: Home/Self Care Barriers to Discharge: No Barriers Identified   Patient Goals and CMS Choice Patient states their goals for this hospitalization and ongoing recovery are:: return home      Discharge Placement                       Discharge Plan and Services                DME Arranged: Walker rolling DME Agency: [employer] Date DME Agency Contacted: 01/19/22 Time DME Agency Contacted: 2119 Representative spoke with at DME Agency: [place] Interventions Housing Interventions: Intervention Not Indicated   Readmission Risk Interventions     No data to display

## 2022-01-19 NOTE — Telephone Encounter (Signed)
Error - pt admitted no phone call made

## 2022-01-19 NOTE — Discharge Summary (Signed)
Physician Discharge Summary  Patient ID: April Lane MRN: [id number] DOB/AGE: 1940/09/04 81 y.o.  Admit date: 01/18/2022 Discharge date: 01/19/2022  Admission Diagnoses: Endometrial cancer [facility])  Discharge Diagnoses:  Principal Problem:   Endometrial cancer [facility])   Discharged Condition:  The patient is in good condition and stable for discharge.    Hospital Course: On 01/18/2022, the patient underwent the following: Procedure(s): XI ROBOTIC ASSISTED TOTAL HYSTERECTOMY WITH BILATERAL SALPINGO OOPHORECTOMY,POSSIBLE LAPAROTOMY SENTINEL NODE BIOPSY LYMPH NODE DISSECTION.   The postoperative course was uneventful.  She was discharged to home on postoperative day 1 tolerating a regular diet, ambulating with assistive devices (rolling walker ordered for home), minimal pain.   Consults: None  Significant Diagnostic Studies: Am labs  Treatments: surgery: see above  Discharge Exam (am assessment): Blood pressure 132/74, pulse 83, temperature (!) 97.5 F (36.4 C), temperature source Oral, resp. rate 17, height '5\' 6"'$  (1.676 m), weight 170 lb (77.1 kg), SpO2 99 %. General: alert, cooperative, and no distress Resp: mild diminished in the bases, clear Cardio: regular rate and rhythm, S1, S2 normal, no murmur, click, rub or gallop GI: incision: lap sites to the abdomen with dermabond intact with no drainage and abdomen soft, hypoactive bowel sounds, non-tender Extremities: extremities normal, atraumatic, no cyanosis or edema Purwick canister with clear, yellow urine  Disposition: Discharge disposition: 01-Home or Self Care       Discharge Instructions     Call MD for:  difficulty breathing, headache or visual disturbances   Complete by: As directed    Call MD for:  extreme fatigue   Complete by: As directed    Call MD for:  hives   Complete by: As directed    Call MD for:  persistant dizziness or light-headedness   Complete by: As directed    Call MD for:  persistant nausea  and vomiting   Complete by: As directed    Call MD for:  redness, tenderness, or signs of infection (pain, swelling, redness, odor or green/yellow discharge around incision site)   Complete by: As directed    Call MD for:  severe uncontrolled pain   Complete by: As directed    Call MD for:  temperature >100.4   Complete by: As directed    Diet - low sodium heart healthy   Complete by: As directed    Driving Restrictions   Complete by: As directed    No driving for 1 week(s).  Do not take narcotics and drive. You need to make sure your reaction time has returned.   Increase activity slowly   Complete by: As directed    Lifting restrictions   Complete by: As directed    No lifting greater than 10 lbs, pushing, pulling, straining for 6 weeks.   Sexual Activity Restrictions   Complete by: As directed    No sexual activity, nothing in the vagina, for 8-10 weeks.      Allergies as of 01/19/2022   No Known Allergies      Medication List     STOP taking these medications    aspirin EC 81 MG tablet       TAKE these medications    acetaminophen 500 MG tablet Commonly known as: TYLENOL Take 500 mg by mouth every 6 (six) hours as needed for moderate pain.   amLODipine 5 MG tablet Commonly known as: NORVASC Take 5 mg by mouth daily.   Bee Pollen 1000 MG Tabs Take 2,000 mg by mouth daily.  meclizine 25 MG tablet Commonly known as: ANTIVERT Take 25 mg by mouth 2 (two) times daily as needed (vertigo).   olmesartan 40 MG tablet Commonly known as: BENICAR Take 40 mg by mouth daily.   senna-docusate 8.6-50 MG tablet Commonly known as: Senokot-S Take 2 tablets by mouth at bedtime. For AFTER surgery, do not take if having diarrhea   simvastatin 20 MG tablet Commonly known as: ZOCOR Take 20 mg by mouth at bedtime.   traMADol 50 MG tablet Commonly known as: ULTRAM Take 1 tablet (50 mg total) by mouth every 6 (six) hours as needed for severe pain. For AFTER surgery  only, do not take and drive   Vitamin D 50 MCG (2000 UT) tablet Take 2,000 Units by mouth daily.               Durable Medical Equipment  (From admission, onward)           Start     Ordered   01/19/22 1306  For home use only DME Walker rolling  Once       Question Answer Comment  Walker: With [place] Wheels   Patient needs a walker to treat with the following condition Gait instability      01/19/22 1305            Follow-up Information     Bernadene Bell, MD Follow up on 02/16/2022.   Specialty: Gynecologic Oncology Why: at 1:15 pm at the [facility] for post-op check Contact information: [address] [place] [address] [phone]                 Greater than thirty minutes were spend for face to face discharge instructions and discharge orders/summary in EPIC.   Signed: Dorothyann Gibbs 01/19/2022, 1:11 PM

## 2022-01-19 NOTE — Progress Notes (Signed)
  Transition of Care [facility]) Screening Note   Patient Details  Name: April Lane Date of Birth: 08-08-1940   Transition of Care [facility]) CM/SW Contact:    Lennart Pall, LCSW Phone Number: 01/19/2022, 10:05 AM    Transition of Care Department [facility]) has reviewed patient and no TOC needs have been identified at this time. We will continue to monitor patient advancement through interdisciplinary progression rounds. If new patient transition needs arise, please place a TOC consult.

## 2022-01-19 NOTE — Progress Notes (Signed)
1 Day Post-Op Procedure(s) (LRB): XI ROBOTIC ASSISTED TOTAL HYSTERECTOMY WITH BILATERAL SALPINGO OOPHORECTOMY,POSSIBLE LAPARATOMY (Bilateral) SENTINEL NODE BIOPSY (N/A) LYMPH NODE DISSECTION (N/A)  Subjective: Patient reports doing well. Has tolerated sipping on water overnight. No pain reported this am. She sat in the chair yesterday and tolerated this well. Has not ambulated in halls. Has purwick in place. No flatus reported. No chest pain, dyspnea. Redemonstrated IS since pt was blowing in the device. Family at the bedside. No concerns voiced.  Objective: Vital signs in last 24 hours: Temp:  [97.5 F (36.4 C)-98.4 F (36.9 C)] 98.2 F (36.8 C) (12/13 0524) Pulse Rate:  [65-81] 74 (12/13 0524) Resp:  [14-19] 16 (12/13 0524) BP: (101-140)/(59-84) 110/61 (12/13 0524) SpO2:  [91 %-100 %] 97 % (12/13 0524) Last BM Date : 01/18/22  Intake/Output from previous day: 12/12 0701 - 12/13 0700 In: 3976 [P.O.:720; I.V.:2750; IV Piggyback:100] Out: 2250 [Urine:2200; Blood:50]  Physical Examination: General: alert, cooperative, and no distress Resp: mild diminished in the bases, clear Cardio: regular rate and rhythm, S1, S2 normal, no murmur, click, rub or gallop GI: incision: lap sites to the abdomen with dermabond intact with no drainage and abdomen soft, hypoactive bowel sounds, non-tender Extremities: extremities normal, atraumatic, no cyanosis or edema Purwick canister with clear, yellow urine  Labs: WBC/Hgb/Hct/Plts:  11.6/13.5/43.9/231 (12/13 0500) BUN/Cr/glu/ALT/AST/amyl/lip:  14/0.76/--/--/--/--/-- (12/13 0500)  Assessment: 81 y.o. s/p Procedure(s): XI ROBOTIC ASSISTED TOTAL HYSTERECTOMY WITH BILATERAL SALPINGO OOPHORECTOMY,POSSIBLE LAPARATOMY SENTINEL NODE BIOPSY LYMPH NODE DISSECTION: stable Pain:  Pain is well-controlled on PRN medications.  Heme: Hgb 13.5 and Hct 43.9 this am- appropriate this am compared to preop values and surgical losses.  ID: WBC 11.6 this am. Given  decadron and ancef intra-op. No evidence of infection.  CV: BP and HR stable. Continue to monitor while inpatient.  GI:  Tolerating po: Yes, sipping on water. Antiemetics ordered if needed.  GU: Creatinine 0.76 this am. Adequate output reported.    FEN: No critical values on am Bmet.  Prophylaxis: SCDs and lovenox ordered.  Plan: Saline lock IV Instructed on IS Diet as tolerated this am Encouraged increasing mobility and ambulation with assist  If meeting milestones later today, plan for discharge home Continue plan of care   LOS: 0 days    April Lane 01/19/2022, 8:25 AM

## 2022-01-20 ENCOUNTER — Telehealth: Payer: Self-pay

## 2022-01-20 NOTE — Telephone Encounter (Signed)
LVM for patient to call office for post-op follow up from 12/12 surgery with Dr. Ernestina Patches. Pt was discharged 12/13.

## 2022-01-21 NOTE — Telephone Encounter (Signed)
Spoke with April Lane this morning. She states she is eating, drinking and urinating well. She has not had a BM yet but is passing gas. She is taking senokot as prescribed and encouraged her to drink plenty of water. She denies fever or chills. Incisions are dry and intact. She rates her pain 3/10. Her pain is controlled with Tramadol.     Instructed to call office with any fever, chills, purulent drainage, uncontrolled pain or any other questions or concerns. Patient verbalizes understanding.   Pt aware of post op appointments as well as the office number [phone] and after hours number [phone] to call if she has any questions or concerns

## 2022-01-24 ENCOUNTER — Other Ambulatory Visit: Payer: Self-pay | Admitting: Oncology

## 2022-01-24 NOTE — Progress Notes (Signed)
Gynecologic Oncology Multi-Disciplinary Disposition Conference Note  Date of the Conference: 01/24/2022  Patient Name: April Lane  Referring Provider: Dr. Bridgett Larsson Primary GYN Oncologist: Dr. 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 Disposition is to systemic chemotherapy and vaginal brachytherapy.   This Multidisciplinary conference took place involving physicians from [place], Medical Oncology, Radiation Oncology, Pathology, Radiology along with the Gynecologic Oncology Nurse Practitioner and Gynecologic Oncology Nurse Navigator.  Comprehensive assessment of the patient's malignancy, staging, need for surgery, chemotherapy, radiation therapy, and need for further testing were reviewed. Supportive measures, both inpatient and following discharge were also discussed. The recommended plan of care is documented. Greater than 35 minutes were spent correlating and coordinating this patient's care.

## 2022-01-25 ENCOUNTER — Telehealth: Payer: Self-pay | Admitting: Oncology

## 2022-01-25 NOTE — Telephone Encounter (Signed)
Called Dionne (daughter) and rescheduled Mamye's postop appointment to 02/09/22 at 12:15.

## 2022-01-26 LAB — SURGICAL PATHOLOGY

## 2022-02-09 ENCOUNTER — Inpatient Hospital Stay: Payer: Medicare HMO | Attending: Psychiatry | Admitting: Psychiatry

## 2022-02-09 ENCOUNTER — Encounter (HOSPITAL_COMMUNITY): Payer: Self-pay

## 2022-02-09 ENCOUNTER — Encounter: Payer: Self-pay | Admitting: Oncology

## 2022-02-09 ENCOUNTER — Other Ambulatory Visit: Payer: Self-pay

## 2022-02-09 VITALS — BP 118/67 | HR 63 | Temp 98.6°F | Resp 14 | Wt 161.8 lb

## 2022-02-09 DIAGNOSIS — C541 Malignant neoplasm of endometrium: Secondary | ICD-10-CM

## 2022-02-09 DIAGNOSIS — K769 Liver disease, unspecified: Secondary | ICD-10-CM

## 2022-02-09 DIAGNOSIS — Z90722 Acquired absence of ovaries, bilateral: Secondary | ICD-10-CM | POA: Diagnosis not present

## 2022-02-09 DIAGNOSIS — Z90710 Acquired absence of both cervix and uterus: Secondary | ICD-10-CM | POA: Diagnosis not present

## 2022-02-09 NOTE — Progress Notes (Signed)
Referral placed for radiation oncology per Dr. Ernestina Patches.

## 2022-02-09 NOTE — Progress Notes (Addendum)
Gynecologic Oncology Return Clinic Visit  Date of Service: 02/09/2022 Referring Provider: Hurshel Party, MD   Assessment & Plan: April Lane is a 82 y.o. 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  Postop: - Pt recovering well from surgery and healing appropriately postoperatively - Intraoperative findings and pathology results reviewed. - Ongoing postoperative expectations and precautions reviewed. Continue with no lifting >10lbs through 6 weeks postoperatively. Nothing in the vagina x8 weeks.   Serous endometrial cancer: - Reviewed her pathology results in detail. - Reviewed the nature of serous endometrial cancer and that it tends to be a more aggressive type of endometrial cancer. - Reviewed recommendation for systemic chemotherapy and vaginal cuff brachytherapy. Overview of side effects and expectations of treatment reviewed.  - Pt is not sure that she would desire any additional treatment. She is amenable to the referrals to discuss further. We discussed that if no treatment, she is at higher likelihood of recurrence. She feels that she has lived a good life and if she were to recur she would likely defer treatment and focus on symptom management hospice.  - Have recommended MRI liver to complete imaging from prior findings on CT scan. Ordered. - Will have pt follow-up with me in 3 months unless she pursues adjuvant treatment and will have her follow-up with me after completion.  - Surveillance and signs/symptoms of recurrence reviewed.   Raised erythematous lesion: - location on the back of her left arm. On review of MAR it does appear that she received her preop heparin in this arm. She subsequently received lovenox in her abdomen postop. - Unclear if this is related to her heparin. Would seem odd that it would arise 2 weeks later. No reaction to lovenox. - Recommended f/u with PCP  in case this represented a different etiology like a reaction to a bug bite of some sort. - Pt otherwise asymptomatic.   RTC 3 months .  MBernadene Bell MD Gynecologic Oncology   Medical Decision Making I personally spent  TOTAL 35 minutes face-to-face and non-face-to-face in the care of this patient, which includes all pre, intra, and post visit time on the date of service.   ----------------------- Reason for Visit: Postop/Treatment counseling  Treatment History: Oncology History Overview Note  High grade serous   Endometrial cancer ([place]  11/15/2021 Imaging   Pelvic ultrasound: Noted a thickened and heterogeneous endometrium measuring 2 cm.  Uterus was also noted to have multiple small fibroids with the total uterus measuring 9.5 cm.     12/17/2021 Initial Biopsy   High-grade endometrial carcinoma, p53 mutated, FIGO grade 3 Comment: The immunohistochemistry and morphology are most consistent with high-grade serous carcinomaPatient presented to her provider for postmenopausal bleeding   12/17/2021 Initial Diagnosis   Endometrial cancer ([place]   01/18/2022 Pathology Results   FINAL MICROSCOPIC DIAGNOSIS:  A.   SENTINAL LYMPH NODE, LEFT, OBTURATOR: -    Lymph node with Mullerian inclusions. -    No metastatic malignancy identified (0/1)  B.   SENTINAL LYMPH NODE, RIGHT, OBTURATOR: -    Lymph node with no malignancy identified (0/1).  C.   LYMPH NODE, LEFT, UTEROSACRAL, RESECTION: -    Calcification. -    Fibrous tissue with benign Mullerian inclusions. -    No lymph node tissue identified. -    No malignancy identified.  D.   UTERUS, CERVIX, BILATERAL TUBES, OVARIES; TOTAL HYSTERECTOMY WITH  BILATERAL SALPINGINGO-OOPHORECTOMY: -    Serous carcinoma, endometrial, with all margins negative for carcinoma.  -    Cervix:             Negative for dysplasia and malignancy. Myometrium:         Leiomyomas. -    Ovaries:       Senescent ovaries, with no malignancy  identified.                     Left ovary with benign serous inclusions. -     Fallopian tubes:   No malignancy identified. 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   ONCOLOGY TABLE:  UTERUS, CARCINOMA OR CARCINOSARCOMA: Resection  Procedure: Total hysterectomy and bilateral salpingo-oophorectomy Histologic Type: Serous carcinoma Histologic Grade: High grade Myometrial Invasion:      Depth of Myometrial Invasion (mm): 23 mm (slide D10)      Myometrial Thickness (mm): 26 mm (slide D10)      Percentage of Myometrial Invasion: 90% Uterine Serosa Involvement: Not identified Cervical stromal Involvement: Not identified Extent of involvement of other tissue/organs: Not identified Peritoneal/Ascitic Fluid: Not known Lymphovascular Invasion: No definite lymphovascular invasion identified Regional Lymph Nodes: Regional lymph nodes present, all regional lymph nodes negative for tumor      Pelvic Lymph Nodes Examined: 2 Sentinel                                    0 Non-sentinel                                    2 Total      Pelvic Lymph Nodes with Metastasis: Not applicable      Para-aortic Lymph Nodes Examined:  Not applicable Distant Metastasis:  Not applicable      Distant Site(s) Involved: Not applicable Pathologic Stage Classification (pTNM, AJCC 8th Edition): pT1b, pN0(sn) Ancillary Studies: MMR / MSI testing will be ordered Representative Tumor Block: D10    01/18/2022 Cancer Staging   Staging form: Corpus Uteri - Carcinoma and Carcinosarcoma, AJCC 8th Edition - Clinical stage from 01/18/2022: FIGO Stage IB (cT1b, cN0(sn), cM0) - Signed by Bernadene Bell, MD on 02/11/2022 Histopathologic type: Serous cystadenocarcinoma, NOS Stage prefix: Initial diagnosis Method of lymph node assessment: Sentinel lymph node biopsy Histologic grade (G): G3 Histologic grading system: 3 grade system Lymph-vascular invasion (LVI): LVI not present (absent)/not  identified     Interval History: Pt reports that she is recovering well from surgery. She is not needing anything for pain. She is eating and drinking well. She is voiding without issue and having regular bowel movements. Denies any vaginal bleeding. She does note a red lesion on her left upper arm which she first noted on Christmas. Not bothering her but hasn't gone away.    Past Medical/Surgical History: Past Medical History:  Diagnosis Date   Arthritis    Cataract    left eye immature   Diverticulitis    Endometrial cancer ([place])    Fatigue    GI bleed    many yrs ago   History of blood transfusion    no abnormal reactions   HTN (hypertension)    takes Amlodipine and Losartan daily   Nausea with vomiting 12/01/2012   Osteoporosis    steroid injection right knee a few yrs ago  Peptic ulcer    Vitamin D deficiency     Past Surgical History:  Procedure Laterality Date   CHOLECYSTECTOMY N/A 01/14/2013   Procedure: LAPAROSCOPIC CHOLECYSTECTOMY WITH INTRAOPERATIVE CHOLANGIOGRAM;  Surgeon: Merrie Roof, MD;  Location: [facility];  Service: General;  Laterality: N/A;   LYMPH NODE DISSECTION N/A 01/18/2022   Procedure: LYMPH NODE DISSECTION;  Surgeon: Bernadene Bell, MD;  Location: [facility];  Service: Gynecology;  Laterality: N/A;   ROBOTIC ASSISTED TOTAL HYSTERECTOMY WITH BILATERAL SALPINGO OOPHERECTOMY Bilateral 01/18/2022   Procedure: XI ROBOTIC ASSISTED TOTAL HYSTERECTOMY WITH BILATERAL SALPINGO OOPHORECTOMY,POSSIBLE LAPARATOMY;  Surgeon: Bernadene Bell, MD;  Location: [facility];  Service: Gynecology;  Laterality: Bilateral;   SENTINEL NODE BIOPSY N/A 01/18/2022   Procedure: SENTINEL NODE BIOPSY;  Surgeon: Bernadene Bell, MD;  Location: [facility];  Service: Gynecology;  Laterality: N/A;   TUBAL LIGATION     at age 30    Family History  Problem Relation Age of Onset   Other Mother 48       Died of old age   Heart attack Father 20       Died of natural causes   Prostate  cancer Brother    Breast cancer Neg Hx    Ovarian cancer Neg Hx    Uterine cancer Neg Hx    Colon cancer Neg Hx     Social History   Socioeconomic History   Marital status: Single    Spouse name: Not on file   Number of children: Not on file   Years of education: Not on file   Highest education level: Not on file  Occupational History   Occupation: CNA    Employer: OTHER  Tobacco Use   Smoking status: Never   Smokeless tobacco: Never  Vaping Use   Vaping Use: Never used  Substance and Sexual Activity   Alcohol use: No   Drug use: No   Sexual activity: Not on file  Other Topics Concern   Not on file  Social History Narrative   Not on file   Social Determinants of Health   Financial Resource Strain: Not on file  Food Insecurity: No Food Insecurity (01/18/2022)   Hunger Vital Sign    Worried About Running Out of Food in the Last Year: Never true    Ran Out of Food in the Last Year: Never true  Transportation Needs: No Transportation Needs (01/18/2022)   PRAPARE - Hydrologist (Medical): No    Lack of Transportation (Non-Medical): No  Physical Activity: Not on file  Stress: Not on file  Social Connections: Not on file    Current Medications:  Current Outpatient Medications:    acetaminophen (TYLENOL) 500 MG tablet, Take 500 mg by mouth every 6 (six) hours as needed for moderate pain., Disp: , Rfl:    amLODipine (NORVASC) 5 MG tablet, Take 5 mg by mouth daily., Disp: , Rfl:    Bee Pollen 1000 MG TABS, Take 2,000 mg by mouth daily., Disp: , Rfl:    Cholecalciferol (VITAMIN D) 50 MCG (2000 UT) tablet, Take 2,000 Units by mouth daily., Disp: , Rfl:    meclizine (ANTIVERT) 25 MG tablet, Take 25 mg by mouth 2 (two) times daily as needed (vertigo)., Disp: , Rfl: 1   olmesartan (BENICAR) 40 MG tablet, Take 40 mg by mouth daily., Disp: , Rfl:    simvastatin (ZOCOR) 20 MG tablet, Take 20 mg by mouth at bedtime., Disp: , Rfl: 3  Review of  Symptoms: Complete 10-system review is positive for: Vaginal bleeding that has since resolved  Physical Exam: BP 118/67 (BP Location: Left Arm, Patient Position: Sitting)   Pulse 63   Temp 98.6 F (37 C) (Oral)   Resp 14   Wt 161 lb 12.8 oz (73.4 kg)   SpO2 99%   BMI 26.12 kg/m  General: Alert, oriented, no acute distress. HEENT: Normocephalic, atraumatic. Neck symmetric without masses. Sclera anicteric.  Chest: Normal work of breathing. Clear to auscultation bilaterally.   Cardiovascular: Regular rate and rhythm, no murmurs. Abdomen: Soft, nontender.  Normoactive bowel sounds.  No masses or hepatosplenomegaly appreciated.  Well healing laparoscopic incisions with glue.  Extremities: Grossly normal range of motion.  Warm, well perfused.  No edema bilaterally. Skin: Left posterior upper arm  with approximately a 6cm round red, slightly raised lesion with some skin peeling.  GU: Normal appearing external genitalia without erythema, excoriation, or lesions.  Speculum exam reveals well healing vaginal cuff, intact, with sutures in places.  Bimanual exam reveals intact vaginal cuff with no tenderness or fluctuance. Exam chaperoned by Kimberly Martinique, [place]    Laboratory & Radiologic Studies: FINAL MICROSCOPIC DIAGNOSIS:  A.   SENTINAL LYMPH NODE, LEFT, OBTURATOR: -    Lymph node with Mullerian inclusions. -    No metastatic malignancy identified (0/1)  B.   SENTINAL LYMPH NODE, RIGHT, OBTURATOR: -    Lymph node with no malignancy identified (0/1).  C.   LYMPH NODE, LEFT, UTEROSACRAL, RESECTION: -    Calcification. -    Fibrous tissue with benign Mullerian inclusions. -    No lymph node tissue identified. -    No malignancy identified.  D.   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  -    Cervix:             Negative for dysplasia and  malignancy. Myometrium:         Leiomyomas. -    Ovaries:       Senescent ovaries, with no malignancy identified.                     Left ovary with benign serous inclusions. -     Fallopian tubes:   No malignancy identified. 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

## 2022-02-09 NOTE — Patient Instructions (Signed)
It was a pleasure to see you in clinic today. - We will place referrals to medical oncology and radiation oncology - Return visit planned for 3 months  Thank you very much for allowing me to provide care for you today.  I appreciate your confidence in choosing our Gynecologic Oncology team at [facility].  If you have any questions about your visit today please call our office or send [place] a MyChart message and we will get back to you as soon as possible.

## 2022-02-11 ENCOUNTER — Encounter: Payer: Self-pay | Admitting: Hematology and Oncology

## 2022-02-11 ENCOUNTER — Other Ambulatory Visit: Payer: Self-pay

## 2022-02-11 ENCOUNTER — Inpatient Hospital Stay: Payer: Medicare HMO | Admitting: Hematology and Oncology

## 2022-02-11 ENCOUNTER — Encounter: Payer: Self-pay | Admitting: Psychiatry

## 2022-02-11 VITALS — BP 128/65 | HR 81 | Temp 99.0°F | Resp 18 | Ht 66.0 in | Wt 169.0 lb

## 2022-02-11 DIAGNOSIS — C541 Malignant neoplasm of endometrium: Secondary | ICD-10-CM | POA: Diagnosis not present

## 2022-02-11 NOTE — Progress Notes (Signed)
START ON PATHWAY REGIMEN - Uterine     A cycle is every 21 days:     Paclitaxel      Carboplatin   **Always confirm dose/schedule in your pharmacy ordering system**  Patient Characteristics: Serous Carcinoma, Newly Diagnosed, Postoperative (Pathologic Staging), Stage I/II Histology: Serous Carcinoma Therapeutic Status: Newly Diagnosed, Postoperative (Pathologic Staging) AJCC M Category: cM0 AJCC 8 Stage Grouping: I AJCC T Category: pT1 AJCC N Category: pN0 Intent of Therapy: Curative Intent, Discussed with Patient

## 2022-02-11 NOTE — Progress Notes (Signed)
[place] CONSULT NOTE  Patient Care Team: Bernerd Limbo, MD as PCP - General (Family Medicine)  ASSESSMENT & PLAN:  Endometrial cancer [facility]) We discussed the role of adjuvant treatment We reviewed the NCCN guidelines We discussed the role of chemotherapy. The intent is of curative intent.  We discussed some of the risks, benefits, side-effects of carboplatin & Taxol. Treatment is intravenous, every 3 weeks x 6 cycles  Some of the short term side-effects included, though not limited to, including weight loss, life threatening infections, risk of allergic reactions, need for transfusions of blood products, nausea, vomiting, change in bowel habits, loss of hair, admission to hospital for various reasons, and risks of death.   Long term side-effects are also discussed including risks of infertility, permanent damage to nerve function, hearing loss, chronic fatigue, kidney damage with possibility needing hemodialysis, and rare secondary malignancy including bone marrow disorders. We discussed premedication with dexamethasone before chemotherapy. She has reasonable venous access and would not need port placement We discussed chemo education class and started of treatment around January 19 At the end of discussion, the patient is leaning towards no adjuvant treatment She understood the risk of not pursuing adjuvant treatment She will call us back on Monday for final decision  Orders Placed This Encounter  Procedures   CBC with Differential ([place] Only)    Standing Status:   Future    Standing Expiration Date:   02/26/2023   CMP ([place] only)    Standing Status:   Future    Standing Expiration Date:   02/26/2023   CBC with Differential ([place] Only)    Standing Status:   Future    Standing Expiration Date:   03/19/2023   CMP ([place] only)    Standing Status:   Future    Standing Expiration Date:   03/19/2023   CBC with Differential ([place]  Only)    Standing Status:   Future    Standing Expiration Date:   04/09/2023   CMP ([place] only)    Standing Status:   Future    Standing Expiration Date:   04/09/2023   CBC with Differential ([place] Only)    Standing Status:   Future    Standing Expiration Date:   04/30/2023   CMP ([place] only)    Standing Status:   Future    Standing Expiration Date:   04/30/2023   CBC with Differential ([place] Only)    Standing Status:   Future    Standing Expiration Date:   05/21/2023   CMP ([place] only)    Standing Status:   Future    Standing Expiration Date:   05/21/2023   CBC with Differential ([place] Only)    Standing Status:   Future    Standing Expiration Date:   06/11/2023   CMP ([place] only)    Standing Status:   Future    Standing Expiration Date:   06/11/2023    The total time spent in the appointment was 60 minutes encounter with patients including review of chart and various tests results, discussions about plan of care and coordination of care plan   All questions were answered. The patient knows to call the clinic with any problems, questions or concerns. No barriers to learning was detected.  Heath Lark, MD 1/5/20242:35 PM  CHIEF COMPLAINTS/PURPOSE OF CONSULTATION:  Uterine cancer, for adjuvant treatment  HISTORY OF PRESENTING ILLNESS:  April Lane 82 y.o.  female is here because of diagnosis of uterine cancer.  She is here accompanied by her granddaughter and her great granddaughter She is doing well after surgery Her original diagnosis started when she developed postmenopausal bleeding.  Since surgery, this has resolved  I have reviewed her chart and materials related to her cancer extensively and collaborated history with the patient. Summary of oncologic history is as follows: Oncology History Overview Note  High grade serous, MSI stable   Endometrial cancer ([facility])  11/15/2021 Imaging   Pelvic ultrasound: Noted a thickened  and heterogeneous endometrium measuring 2 cm.  Uterus was also noted to have multiple small fibroids with the total uterus measuring 9.5 cm.     11/15/2021 Initial Diagnosis   She presented with PMB   12/17/2021 Initial Biopsy   High-grade endometrial carcinoma, p53 mutated, FIGO grade 3 Comment: The immunohistochemistry and morphology are most consistent with high-grade serous carcinomaPatient presented to her provider for postmenopausal bleeding   12/17/2021 Initial Diagnosis   Endometrial cancer ([place])   01/18/2022 Pathology Results   FINAL MICROSCOPIC DIAGNOSIS:  A.   SENTINAL LYMPH NODE, LEFT, OBTURATOR: -    Lymph node with Mullerian inclusions. -    No metastatic malignancy identified (0/1)  B.   SENTINAL LYMPH NODE, RIGHT, OBTURATOR: -    Lymph node with no malignancy identified (0/1).  C.   LYMPH NODE, LEFT, UTEROSACRAL, RESECTION: -    Calcification. -    Fibrous tissue with benign Mullerian inclusions. -    No lymph node tissue identified. -    No malignancy identified.  D.   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  -    Cervix:             Negative for dysplasia and malignancy. Myometrium:         Leiomyomas. -    Ovaries:       Senescent ovaries, with no malignancy identified.                     Left ovary with benign serous inclusions. -     Fallopian tubes:   No malignancy identified. 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   ONCOLOGY TABLE:  UTERUS, CARCINOMA OR CARCINOSARCOMA: Resection  Procedure: Total hysterectomy and bilateral salpingo-oophorectomy Histologic Type: Serous carcinoma Histologic Grade: High grade Myometrial Invasion:      Depth of Myometrial Invasion (mm): 23 mm (slide D10)      Myometrial Thickness (mm): 26 mm (slide D10)      Percentage of Myometrial Invasion:  90% Uterine Serosa Involvement: Not identified Cervical stromal Involvement: Not identified Extent of involvement of other tissue/organs: Not identified Peritoneal/Ascitic Fluid: Not known Lymphovascular Invasion: No definite lymphovascular invasion identified Regional Lymph Nodes: Regional lymph nodes present, all regional lymph nodes negative for tumor      Pelvic Lymph Nodes Examined: 2 Sentinel                                    0 Non-sentinel                                    2 Total      Pelvic Lymph Nodes with Metastasis: Not applicable  Para-aortic Lymph Nodes Examined:  Not applicable Distant Metastasis:  Not applicable      Distant Site(s) Involved: Not applicable Pathologic Stage Classification (pTNM, AJCC 8th Edition): pT1b, pN0(sn) Ancillary Studies: MMR / MSI testing will be ordered Representative Tumor Block: D10    01/18/2022 Cancer Staging   Staging form: Corpus Uteri - Carcinoma and Carcinosarcoma, AJCC 8th Edition - Clinical stage from 01/18/2022: FIGO Stage IB (cT1b, cN0(sn), cM0) - Signed by Bernadene Bell, MD on 02/11/2022 Histopathologic type: Serous cystadenocarcinoma, NOS Stage prefix: Initial diagnosis Method of lymph node assessment: Sentinel lymph node biopsy Histologic grade (G): G3 Histologic grading system: 3 grade system Lymph-vascular invasion (LVI): LVI not present (absent)/not identified   01/20/2022 Procedure   reoperative Diagnosis: Grade 3 endometrial cancer, p53 mutated, like serous   Postoperative Diagnosis: Same   Procedures: Robotic-assisted total laparoscopic hysterectomy, bilateral salpingo-oophorectomy, bilateral sentinel lymph node evaluation and biopsy   Surgeon: Bernadene Bell, MD Findings: Normal upper abdominal survey with normal liver surface and diaphragm. Normal appearing small and large bowel. Ascending colon at the hepatic flexure adherent to the lateral abdominal wall.  Appendix visualized and adherent to the right  lower quadrant.  Filmy adhesions of the rectosigmoid colon to the left lower abdominal wall, left pelvic sidewall, and cul-de-sac. Extensive diverticulosis present. Few vesicular lesions on the peritoneum of the posterior cul-de-sac that were removed en bloc with the uterine specimen, possible endosalpingosis.  Subcentimeter fibrotic appearing lesion on the left uterosacral ligament, excised.  Globular uterus. Bilateral fallopian tubes adherent to the ovarian fossas with left hydrosalpinx. Normal bilateral ovaries. Sentinel mapping on right to the obturator space; sentinel mapping on left to the obturator space.       02/25/2022 -  Chemotherapy   Patient is on Treatment Plan : UTERINE Carboplatin AUC 6 + Paclitaxel q21d       MEDICAL HISTORY:  Past Medical History:  Diagnosis Date   Arthritis    Cataract    left eye immature   Diverticulitis    Endometrial cancer ([place])    Fatigue    GI bleed    many yrs ago   History of blood transfusion    no abnormal reactions   HTN (hypertension)    takes Amlodipine and Losartan daily   Nausea with vomiting 12/01/2012   Osteoporosis    steroid injection right knee a few yrs ago   Peptic ulcer    Vitamin D deficiency     SURGICAL HISTORY: Past Surgical History:  Procedure Laterality Date   CHOLECYSTECTOMY N/A 01/14/2013   Procedure: LAPAROSCOPIC CHOLECYSTECTOMY WITH INTRAOPERATIVE CHOLANGIOGRAM;  Surgeon: Merrie Roof, MD;  Location: [place];  Service: General;  Laterality: N/A;   LYMPH NODE DISSECTION N/A 01/18/2022   Procedure: LYMPH NODE DISSECTION;  Surgeon: Bernadene Bell, MD;  Location: [facility];  Service: Gynecology;  Laterality: N/A;   ROBOTIC ASSISTED TOTAL HYSTERECTOMY WITH BILATERAL SALPINGO OOPHERECTOMY Bilateral 01/18/2022   Procedure: XI ROBOTIC ASSISTED TOTAL HYSTERECTOMY WITH BILATERAL SALPINGO OOPHORECTOMY,POSSIBLE LAPARATOMY;  Surgeon: Bernadene Bell, MD;  Location: [facility];  Service: Gynecology;  Laterality: Bilateral;    SENTINEL NODE BIOPSY N/A 01/18/2022   Procedure: SENTINEL NODE BIOPSY;  Surgeon: Bernadene Bell, MD;  Location: [facility];  Service: Gynecology;  Laterality: N/A;   TUBAL LIGATION     at age 24    SOCIAL HISTORY: Social History   Socioeconomic History   Marital status: Single    Spouse name: Not on file   Number of children: Not on  file   Years of education: Not on file   Highest education level: Not on file  Occupational History   Occupation: CNA    Employer: OTHER  Tobacco Use   Smoking status: Never   Smokeless tobacco: Never  Vaping Use   Vaping Use: Never used  Substance and Sexual Activity   Alcohol use: No   Drug use: No   Sexual activity: Not on file  Other Topics Concern   Not on file  Social History Narrative   Not on file   Social Determinants of Health   Financial Resource Strain: Not on file  Food Insecurity: No Food Insecurity (01/18/2022)   Hunger Vital Sign    Worried About Running Out of Food in the Last Year: Never true    Ran Out of Food in the Last Year: Never true  Transportation Needs: No Transportation Needs (01/18/2022)   PRAPARE - Hydrologist (Medical): No    Lack of Transportation (Non-Medical): No  Physical Activity: Not on file  Stress: Not on file  Social Connections: Not on file  Intimate Partner Violence: Not At Risk (01/18/2022)   Humiliation, Afraid, Rape, and Kick questionnaire    Fear of Current or Ex-Partner: No    Emotionally Abused: No    Physically Abused: No    Sexually Abused: No    FAMILY HISTORY: Family History  Problem Relation Age of Onset   Other Mother 69       Died of old age   Heart attack Father 56       Died of natural causes   Prostate cancer Brother    Breast cancer Neg Hx    Ovarian cancer Neg Hx    Uterine cancer Neg Hx    Colon cancer Neg Hx     ALLERGIES:  has No Known Allergies.  MEDICATIONS:  Current Outpatient Medications  Medication Sig Dispense Refill    acetaminophen (TYLENOL) 500 MG tablet Take 500 mg by mouth every 6 (six) hours as needed for moderate pain.     amLODipine (NORVASC) 5 MG tablet Take 5 mg by mouth daily.     Bee Pollen 1000 MG TABS Take 2,000 mg by mouth daily.     Cholecalciferol (VITAMIN D) 50 MCG (2000 UT) tablet Take 2,000 Units by mouth daily.     meclizine (ANTIVERT) 25 MG tablet Take 25 mg by mouth 2 (two) times daily as needed (vertigo).  1   olmesartan (BENICAR) 40 MG tablet Take 40 mg by mouth daily.     simvastatin (ZOCOR) 20 MG tablet Take 20 mg by mouth at bedtime.  3   No current facility-administered medications for this visit.    REVIEW OF SYSTEMS:   Constitutional: Denies fevers, chills or abnormal night sweats Eyes: Denies blurriness of vision, double vision or watery eyes Ears, nose, mouth, throat, and face: Denies mucositis or sore throat Respiratory: Denies cough, dyspnea or wheezes Cardiovascular: Denies palpitation, chest discomfort or lower extremity swelling Gastrointestinal:  Denies nausea, heartburn or change in bowel habits Skin: Denies abnormal skin rashes Lymphatics: Denies new lymphadenopathy or easy bruising Neurological:Denies numbness, tingling or new weaknesses Behavioral/Psych: Mood is stable, no new changes  All other systems were reviewed with the patient and are negative.  PHYSICAL EXAMINATION: ECOG PERFORMANCE STATUS: 1 - Symptomatic but completely ambulatory  Vitals:   02/11/22 1318  BP: 128/65  Pulse: 81  Resp: 18  Temp: 99 F (37.2 C)  SpO2: 99%  Filed Weights   02/11/22 1318  Weight: 169 lb (76.7 kg)    GENERAL:alert, no distress and comfortable SKIN: skin color, texture, turgor are normal, no rashes or significant lesions EYES: normal, conjunctiva are pink and non-injected, sclera clear OROPHARYNX:no exudate, no erythema and lips, buccal mucosa, and tongue normal  NECK: supple, thyroid normal size, non-tender, without nodularity LYMPH:  no palpable  lymphadenopathy in the cervical, axillary or inguinal LUNGS: clear to auscultation and percussion with normal breathing effort HEART: regular rate & rhythm and no murmurs and no lower extremity edema ABDOMEN:abdomen soft, non-tender and normal bowel sounds.  Noted well-healed surgical scar Musculoskeletal:no cyanosis of digits and no clubbing  PSYCH: alert & oriented x 3 with fluent speech NEURO: no focal motor/sensory deficits  LABORATORY DATA:  I have reviewed the data as listed Lab Results  Component Value Date   WBC 11.6 (H) 01/19/2022   HGB 13.5 01/19/2022   HCT 43.9 01/19/2022   MCV 88.3 01/19/2022   PLT 231 01/19/2022   Recent Labs    11/13/21 2117 01/17/22 1326 01/19/22 0500  NA 137 139 136  K 3.9 4.6 4.8  CL 105 106 106  CO2 '25 27 24  '$ GLUCOSE 114* 95 142*  BUN '15 17 14  '$ CREATININE 0.85 0.73 0.76  CALCIUM 10.2 10.5* 9.2  GFRNONAA >60 >60 >60  PROT  --  7.8  --   ALBUMIN  --  4.1  --   AST  --  22  --   ALT  --  11  --   ALKPHOS  --  66  --   BILITOT  --  0.6  --

## 2022-02-11 NOTE — Assessment & Plan Note (Signed)
We discussed the role of adjuvant treatment We reviewed the NCCN guidelines We discussed the role of chemotherapy. The intent is of curative intent.  We discussed some of the risks, benefits, side-effects of carboplatin & Taxol. Treatment is intravenous, every 3 weeks x 6 cycles  Some of the short term side-effects included, though not limited to, including weight loss, life threatening infections, risk of allergic reactions, need for transfusions of blood products, nausea, vomiting, change in bowel habits, loss of hair, admission to hospital for various reasons, and risks of death.   Long term side-effects are also discussed including risks of infertility, permanent damage to nerve function, hearing loss, chronic fatigue, kidney damage with possibility needing hemodialysis, and rare secondary malignancy including bone marrow disorders. We discussed premedication with dexamethasone before chemotherapy. She has reasonable venous access and would not need port placement We discussed chemo education class and started of treatment around January 19 At the end of discussion, the patient is leaning towards no adjuvant treatment She understood the risk of not pursuing adjuvant treatment She will call us back on Monday for final decision

## 2022-02-13 ENCOUNTER — Other Ambulatory Visit: Payer: Self-pay

## 2022-02-14 ENCOUNTER — Telehealth: Payer: Self-pay | Admitting: Oncology

## 2022-02-14 NOTE — Progress Notes (Incomplete)
GYN Location of Tumor / Histology: Endometrial  Sherrilyn Rist Metzgar presented with symptoms of: {symptoms; gyn cyclic:13153::"bleeding"}  Biopsies revealed: 01/19/23  Clinical History: Endometrial Cancer ([place])     FINAL MICROSCOPIC DIAGNOSIS:  A.   SENTINAL LYMPH NODE, LEFT, OBTURATOR: -    Lymph node with Mullerian inclusions. -    No metastatic malignancy identified (0/1)  B.   SENTINAL LYMPH NODE, RIGHT, OBTURATOR: -    Lymph node with no malignancy identified (0/1).  C.   LYMPH NODE, LEFT, UTEROSACRAL, RESECTION: -    Calcification. -    Fibrous tissue with benign Mullerian inclusions. -    No lymph node tissue identified. -    No malignancy identified.  D.   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  -    Cervix:             Negative for dysplasia and malignancy. Myometrium:         Leiomyomas. -    Ovaries:       Senescent ovaries, with no malignancy identified.                     Left ovary with benign serous inclusions. -     Fallopian tubes:   No malignancy identified. 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   ONCOLOGY TABLE:  UTERUS, CARCINOMA OR CARCINOSARCOMA: Resection  Procedure: Total hysterectomy and bilateral salpingo-oophorectomy Histologic Type: Serous carcinoma Histologic Grade: High grade Myometrial Invasion:      Depth of Myometrial Invasion (mm): 23 mm (slide D10)      Myometrial Thickness (mm): 26 mm (slide D10)      Percentage of Myometrial Invasion: 90% Uterine Serosa Involvement: Not identified Cervical stromal Involvement: Not identified Extent of involvement of other tissue/organs: Not identified Peritoneal/Ascitic Fluid: Not known Lymphovascular Invasion: No definite lymphovascular invasion identified Regional Lymph Nodes: Regional lymph nodes present, all regional lymph nodes  negative for tumor      Pelvic Lymph Nodes Examined: 2 Sentinel                                    0 Non-sentinel                                    2 Total      Pelvic Lymph Nodes with Metastasis: Not applicable      Para-aortic Lymph Nodes Examined:  Not applicable Distant Metastasis:  Not applicable      Distant Site(s) Involved: Not applicable Pathologic Stage Classification (pTNM, AJCC 8th Edition): pT1b, pN0(sn) Ancillary Studies: MMR / MSI testing will be ordered Representative Tumor Block: D10 Comment(s):  The endometrial carcinoma was interrogated with immunohistochemical (IHC) stains (p53, p16, PR and vimentin).   The carcinoma has diffuse strong p53 and p16 reactivity (over-expression) with mostly negative PR and vimentin tumor cells.  The profile is supportive of serous carcinoma.  The lymph nodes (Blocks A1, B1) were interrogated with CKAE1/AE3 pankeratin IHC stain, which highlights the Mullerian inclusions.  The IHC stains have acceptable controls.  Dr. Saralyn Pilar has reviewed the slides (intradepartmental QA review for malignancy).  (v4.2.0.1)     GROSS DESCRIPTION:  A. Received fresh and subsequently placed in formalin labeled with  the patient's name and "Left obturator sentinel lymph node" is a 1.5 x 1.2 x 0.3 cm tan-brown, rubbery possible lymph node that is sectioned and entirely submitted in cassette A1.  B. Received fresh and subsequently placed in formalin labeled with the patient's name and "Right obturator sentinel lymph node" is a 1.9 x 1.2 x 0.3 cm tan-brown, rubbery possible lymph node that is sectioned and entirely submitted in cassette B1.  C. Received fresh and subsequently placed in formalin labeled with the patient's name and "Left ureterosacral nodule" is a 0.7 cm piece of pink-brown, ragged soft tissue that is bisected and entirely submitted in cassette C1.  D. Specimen: received fresh and subsequently placed in formalin labeled with  the patient's name and "Uterus, cervix, tubes  ovaries." Integrity: intact Weight: 172 g Uterus Size and Shape: 7.2 x 6.3 x 5.3 cm Serosa: pink-brown and slightly shaggy. Cervix: 6.5 cm long and 3.1 cm in diameter with green ink-stained ectocervical mucosa surrounding a 1.3 cm patent, slit-like external os. At the squamocolumnar junction posteriorly is a 1.0 x 0.7 x 0.3 cm polypoid lesion. Endometrium: a 4.5 x 4.5 x 3.4 cm red-tan, fungating, somewhat friable mass located 2.2 cm from the internal os anteriorly and 3.1 cm from the internal os posteriorly. Myometrium: On cut surface, the mass invades up to 0.3 cm deep into the 0.7 cm thick myometrium, remaining at least 0.5 cm from the serosa. Right adnexa: fallopian tube:  0.4 cm red-tan, fimbriated fallopian tube with unremarkable cut and serosal surfaces.  Ovary: 5.5 g, 3.3 x 2.0 x 1.30 cm.  Tan, cerebriform outer surface with normal ovarian parenchyma. Left adnexa: fallopian tube: 5.5 x 0.2 cm red-tan, fimbriated fallopian tube with unremarkable serosa. The lumen is severely distended with a copious amount of clear, yellow-tinged, watery fluid. Ovary: 4.0 g, 2.5 x 1.7 x 1.2 cm. Tan, vaguely lobular outer surface with normal ovarian parenchyma. Block Summary: D1: anterior cervix D2: anterior lower uterine segment D3: posterior cervix (with polyp) D4: posterior lower uterine segment D5: remaining posterior cervical polyp D6: anterior mass with serosa, deepest invasion/closest serosa (green) D7: anterior mass with adjacent myometrial nodule D8: anterior mass with submucosal nodule D9-D10: posterior mass to serosa D11-D12: posterior mass involving submucosal nodule D13: calcified myometrial nodules after decalcification D14: right FT fimbria and representative cross sections D15: representative right ovary D16: left FT fimbria and representative cross sections D17: representative left ovary  (LEF 01/19/2022)  Past/Anticipated  interventions by Gyn/Onc surgery, if any:  Bernadene Bell, MD Primary    Lahoma Crocker, MD Assisting [phone]    XI ROBOTIC ASSISTED TOTAL HYSTERECTOMY WITH BILATERAL SALPINGO OOPHORECTOMY,POSSIBLE LAPARATOMY Bilateral General  SENTINEL NODE BIOPSY N/A General  LYMPH NODE DISSECTIO     Procedure Laterality Anesthesia  XI ROBOTIC ASSISTED TOTAL HYSTERECTOMY WITH BILATERAL SALPINGO OOPHORECTOMY,POSSIBLE LAPARATOMY Bilateral General  SENTINEL NODE BIOPSY N/A General  LYMPH NODE DIS        Past/Anticipated interventions by medical oncology, if any:  /19/2024 -  Chemotherapy    Patient is on Treatment Plan : UTERINE Carboplatin AUC 6 + Paclitaxel q21d     Weight changes, if any: {:18581}  Bowel/Bladder complaints, if any: {yes no:314532}, {Blank single:19197::"diarrhea","constipation","urinary frequency","burning","trouble emptying bladder"," "}  Nausea/Vomiting, if any: {:18581}  Pain issues, if any:  {:18581}  SAFETY ISSUES: Prior radiation? {:18581} Pacemaker/ICD? {:18581} Possible current pregnancy? {:18581} Is the patient on methotrexate? {:18581}  Current Complaints / other details:  ***

## 2022-02-14 NOTE — Telephone Encounter (Signed)
[place] regarding her treatment decision.  She said she has decided not to have chemotherapy or radiation.  Advised I will let Dr. Alvy Bimler know and will cancel her radiation consult with Dr. Sondra Come.

## 2022-02-15 ENCOUNTER — Other Ambulatory Visit: Payer: Self-pay | Admitting: Hematology and Oncology

## 2022-02-15 NOTE — Telephone Encounter (Signed)
Pls make sure Dr. Ernestina Patches is aware

## 2022-02-16 ENCOUNTER — Encounter: Payer: Medicare HMO | Admitting: Psychiatry

## 2022-02-18 ENCOUNTER — Ambulatory Visit (HOSPITAL_BASED_OUTPATIENT_CLINIC_OR_DEPARTMENT_OTHER): Payer: Medicare HMO

## 2022-02-21 ENCOUNTER — Ambulatory Visit: Payer: Medicare HMO

## 2022-02-21 ENCOUNTER — Ambulatory Visit
Admission: RE | Admit: 2022-02-21 | Discharge: 2022-02-21 | Disposition: A | Discharge status: Home / Self Care | Payer: Medicare HMO | Source: Ambulatory Visit | Attending: Radiation Oncology | Admitting: Radiation Oncology

## 2022-02-21 DIAGNOSIS — C541 Malignant neoplasm of endometrium: Secondary | ICD-10-CM

## 2022-02-25 ENCOUNTER — Ambulatory Visit (HOSPITAL_BASED_OUTPATIENT_CLINIC_OR_DEPARTMENT_OTHER)
Admission: RE | Admit: 2022-02-25 | Discharge: 2022-02-25 | Disposition: A | Discharge status: Home / Self Care | Payer: Medicare HMO | Source: Ambulatory Visit | Attending: Psychiatry | Admitting: Psychiatry

## 2022-02-25 DIAGNOSIS — K760 Fatty (change of) liver, not elsewhere classified: Secondary | ICD-10-CM | POA: Insufficient documentation

## 2022-02-25 DIAGNOSIS — C541 Malignant neoplasm of endometrium: Secondary | ICD-10-CM | POA: Diagnosis not present

## 2022-02-25 DIAGNOSIS — K769 Liver disease, unspecified: Secondary | ICD-10-CM | POA: Diagnosis present

## 2022-02-25 DIAGNOSIS — F40240 Claustrophobia: Secondary | ICD-10-CM | POA: Diagnosis not present

## 2022-02-25 MED ORDER — GADOBUTROL 1 MMOL/ML IV SOLN
7.5000 mL | Freq: Once | INTRAVENOUS | Status: AC | PRN
  Administered 2022-02-25: 7.5 mL via INTRAVENOUS
  Filled 2022-02-25: qty 7.5

## 2022-03-30 ENCOUNTER — Other Ambulatory Visit: Payer: Self-pay | Admitting: Gynecologic Oncology

## 2022-03-30 DIAGNOSIS — C541 Malignant neoplasm of endometrium: Secondary | ICD-10-CM

## 2022-05-16 ENCOUNTER — Other Ambulatory Visit: Payer: Self-pay

## 2022-05-16 ENCOUNTER — Inpatient Hospital Stay: Payer: Medicare HMO | Attending: Psychiatry | Admitting: Psychiatry

## 2022-05-16 VITALS — BP 120/60 | HR 87 | Temp 99.5°F | Wt 173.8 lb

## 2022-05-16 DIAGNOSIS — Z9221 Personal history of antineoplastic chemotherapy: Secondary | ICD-10-CM | POA: Insufficient documentation

## 2022-05-16 DIAGNOSIS — Z90722 Acquired absence of ovaries, bilateral: Secondary | ICD-10-CM | POA: Diagnosis not present

## 2022-05-16 DIAGNOSIS — Z90710 Acquired absence of both cervix and uterus: Secondary | ICD-10-CM | POA: Insufficient documentation

## 2022-05-16 DIAGNOSIS — C541 Malignant neoplasm of endometrium: Secondary | ICD-10-CM

## 2022-05-16 NOTE — Progress Notes (Unsigned)
Gynecologic Oncology Return Clinic Visit  Date of Service: 05/16/2022 Referring Provider: [name], MD   Assessment & Plan: April Lane is a 82 y.o. 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 She presents for surveillance.  Serous endometrial cancer: - NED on exam today - Pt declined adjuvant treatment. Discussed higher risk of recurrence. She feels that she has lived a good life and if she were to recur she would likely defer treatment and focus on symptom management hospice.  - Surveillance and signs/symptoms of recurrence reviewed.  - CA125 was not a marker for pt. - Recommend follow-up for surveillance q48months initially. - Recommend imaging at 6 months with CT abdomen/pelvis. Ordered today.   RTC 3 months .  Clide Cliff, MD Gynecologic Oncology   Medical Decision Making I personally spent  TOTAL 20 minutes face-to-face and non-face-to-face in the care of this patient, which includes all pre, intra, and post visit time on the date of service.   ----------------------- Reason for Visit: Surveillance  Treatment History: Oncology History Overview Note  High grade serous, MSI stable   Endometrial cancer  11/15/2021 Imaging   Pelvic ultrasound: Noted a thickened and heterogeneous endometrium measuring 2 cm.  Uterus was also noted to have multiple small fibroids with the total uterus measuring 9.5 cm.     11/15/2021 Initial Diagnosis   She presented with PMB   12/17/2021 Initial Biopsy   High-grade endometrial carcinoma, p53 mutated, FIGO grade 3 Comment: The immunohistochemistry and morphology are most consistent with high-grade serous carcinomaPatient presented to her provider for postmenopausal bleeding   12/17/2021 Initial Diagnosis   Endometrial cancer (HCC)   01/18/2022 Pathology Results   FINAL MICROSCOPIC DIAGNOSIS:  A.   SENTINAL LYMPH  NODE, LEFT, OBTURATOR: -    Lymph node with Mullerian inclusions. -    No metastatic malignancy identified (0/1)  B.   SENTINAL LYMPH NODE, RIGHT, OBTURATOR: -    Lymph node with no malignancy identified (0/1).  C.   LYMPH NODE, LEFT, UTEROSACRAL, RESECTION: -    Calcification. -    Fibrous tissue with benign Mullerian inclusions. -    No lymph node tissue identified. -    No malignancy identified.  D.   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  -    Cervix:             Negative for dysplasia and malignancy. Myometrium:         Leiomyomas. -    Ovaries:       Senescent ovaries, with no malignancy identified.                     Left ovary with benign serous inclusions. -     Fallopian tubes:   No malignancy identified. 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   ONCOLOGY TABLE:  UTERUS, CARCINOMA OR CARCINOSARCOMA: Resection  Procedure: Total hysterectomy and bilateral salpingo-oophorectomy Histologic Type: Serous carcinoma Histologic Grade: High grade Myometrial Invasion:      Depth of Myometrial Invasion (mm): 23 mm (slide D10)      Myometrial Thickness (mm): 26 mm (slide D10)      Percentage of Myometrial Invasion: 90% Uterine Serosa Involvement: Not identified Cervical stromal Involvement: Not identified Extent of involvement of other tissue/organs: Not identified Peritoneal/Ascitic Fluid: Not known  Lymphovascular Invasion: No definite lymphovascular invasion identified Regional Lymph Nodes: Regional lymph nodes present, all regional lymph nodes negative for tumor      Pelvic Lymph Nodes Examined: 2 Sentinel                                    0 Non-sentinel                                    2 Total      Pelvic Lymph Nodes with Metastasis: Not applicable      Para-aortic Lymph Nodes Examined:  Not  applicable Distant Metastasis:  Not applicable      Distant Site(s) Involved: Not applicable Pathologic Stage Classification (pTNM, AJCC 8th Edition): pT1b, pN0(sn) Ancillary Studies: MMR / MSI testing will be ordered Representative Tumor Block: D10    01/18/2022 Cancer Staging   Staging form: Corpus Uteri - Carcinoma and Carcinosarcoma, AJCC 8th Edition - Clinical stage from 01/18/2022: FIGO Stage IB (cT1b, cN0(sn), cM0) - Signed by Clide Cliff, MD on 02/11/2022 Histopathologic type: Serous cystadenocarcinoma, NOS Stage prefix: Initial diagnosis Method of lymph node assessment: Sentinel lymph node biopsy Histologic grade (G): G3 Histologic grading system: 3 grade system Lymph-vascular invasion (LVI): LVI not present (absent)/not identified   01/20/2022 Procedure   reoperative Diagnosis: Grade 3 endometrial cancer, p53 mutated, like serous   Postoperative Diagnosis: Same   Procedures: Robotic-assisted total laparoscopic hysterectomy, bilateral salpingo-oophorectomy, bilateral sentinel lymph node evaluation and biopsy   Surgeon: Clide Cliff, MD Findings: Normal upper abdominal survey with normal liver surface and diaphragm. Normal appearing small and large bowel. Ascending colon at the hepatic flexure adherent to the lateral abdominal wall.  Appendix visualized and adherent to the right lower quadrant.  Filmy adhesions of the rectosigmoid colon to the left lower abdominal wall, left pelvic sidewall, and cul-de-sac. Extensive diverticulosis present. Few vesicular lesions on the peritoneum of the posterior cul-de-sac that were removed en bloc with the uterine specimen, possible endosalpingosis.  Subcentimeter fibrotic appearing lesion on the left uterosacral ligament, excised.  Globular uterus. Bilateral fallopian tubes adherent to the ovarian fossas with left hydrosalpinx. Normal bilateral ovaries. Sentinel mapping on right to the obturator space; sentinel mapping on left to the  obturator space.       02/25/2022 - 02/25/2022 Chemotherapy   Patient is on Treatment Plan : UTERINE Carboplatin AUC 6 + Paclitaxel q21d       Interval History: She presents with family. She reports that she is overall doing well. She denies any new vaginal bleeding, abdominal/pelvic pain, unintentional weight loss, change in bowel or bladder habits, early satiety, bloating, nausea/vomiting.    Past Medical/Surgical History: Past Medical History:  Diagnosis Date   Arthritis    Cataract    left eye immature   Diverticulitis    Endometrial cancer (HCC)    Fatigue    GI bleed    many yrs ago   History of blood transfusion    no abnormal reactions   HTN (hypertension)    takes Amlodipine and Losartan daily   Nausea with vomiting 12/01/2012   Osteoporosis    steroid injection right knee a few yrs ago   Peptic ulcer    Vitamin D deficiency     Past Surgical History:  Procedure Laterality Date   CHOLECYSTECTOMY N/A 01/14/2013   Procedure: LAPAROSCOPIC  CHOLECYSTECTOMY WITH INTRAOPERATIVE CHOLANGIOGRAM;  Surgeon: Robyne AskewPaul S Toth III, MD;  [facility];  Service: General;  Laterality: N/A;   LYMPH NODE DISSECTION N/A 01/18/2022   Procedure: LYMPH NODE DISSECTION;  Surgeon: Clide CliffNewton, Maurine Mowbray, MD;  Location: [facility];  Service: Gynecology;  Laterality: N/A;   ROBOTIC ASSISTED TOTAL HYSTERECTOMY WITH BILATERAL SALPINGO OOPHERECTOMY Bilateral 01/18/2022   Procedure: XI ROBOTIC ASSISTED TOTAL HYSTERECTOMY WITH BILATERAL SALPINGO OOPHORECTOMY,POSSIBLE LAPARATOMY;  Surgeon: Clide CliffNewton, Odessa Morren, MD;  Location: [facility];  Service: Gynecology;  Laterality: Bilateral;   SENTINEL NODE BIOPSY N/A 01/18/2022   Procedure: SENTINEL NODE BIOPSY;  Surgeon: Clide CliffNewton, Dasha Kawabata, MD;  Location: [facility];  Service: Gynecology;  Laterality: N/A;   TUBAL LIGATION     at age 82    Family History  Problem Relation Age of Onset   Other Mother [id number]       Died of old age   Heart attack Father [id number]       Died of natural  causes   Prostate cancer Brother    Breast cancer Neg Hx    Ovarian cancer Neg Hx    Uterine cancer Neg Hx    Colon cancer Neg Hx     Social History   Socioeconomic History   Marital status: Single    Spouse name: Not on file   Number of children: Not on file   Years of education: Not on file   Highest education level: Not on file  Occupational History   Occupation: CNA    Employer: OTHER  Tobacco Use   Smoking status: Never   Smokeless tobacco: Never  Vaping Use   Vaping Use: Never used  Substance and Sexual Activity   Alcohol use: No   Drug use: No   Sexual activity: Not on file  Other Topics Concern   Not on file  Social History Narrative   Not on file   Social Determinants of Health   Financial Resource Strain: Not on file  Food Insecurity: No Food Insecurity (01/18/2022)   Hunger Vital Sign    Worried About Running Out of Food in the Last Year: Never true    Ran Out of Food in the Last Year: Never true  Transportation Needs: No Transportation Needs (01/18/2022)   PRAPARE - Administrator, Civil ServiceTransportation    Lack of Transportation (Medical): No    Lack of Transportation (Non-Medical): No  Physical Activity: Not on file  Stress: Not on file  Social Connections: Not on file    Current Medications:  Current Outpatient Medications:    acetaminophen (TYLENOL) 500 MG tablet, Take 500 mg by mouth every 6 (six) hours as needed for moderate pain., Disp: , Rfl:    amLODipine (NORVASC) 5 MG tablet, Take 5 mg by mouth daily., Disp: , Rfl:    Bee Pollen 1000 MG TABS, Take 2,000 mg by mouth daily., Disp: , Rfl:    Cholecalciferol (VITAMIN D) 50 MCG (2000 UT) tablet, Take 2,000 Units by mouth daily., Disp: , Rfl:    meclizine (ANTIVERT) 25 MG tablet, Take 25 mg by mouth 2 (two) times daily as needed (vertigo)., Disp: , Rfl: 1   olmesartan (BENICAR) 40 MG tablet, Take 40 mg by mouth daily., Disp: , Rfl:    simvastatin (ZOCOR) 20 MG tablet, Take 20 mg by mouth at bedtime., Disp: , Rfl:  3  Review of Symptoms: Complete 10-system review is positive for: Hearing loss, joint pain of the knee, vision problem, occasional cough  Physical Exam: BP 120/60 (BP Location:  Left Arm, Patient Position: Sitting)   Pulse 87   Temp 99.5 F (37.5 C) (Oral)   Wt 173 lb 12.8 oz (78.8 kg)   SpO2 99%   BMI 28.05 kg/m  General: Alert, oriented, no acute distress. HEENT: Normocephalic, atraumatic. Neck symmetric without masses. Sclera anicteric.  Chest: Normal work of breathing. Clear to auscultation bilaterally.   Cardiovascular: Regular rate and rhythm, no murmurs. Abdomen: Soft, nontender.  Normoactive bowel sounds.  No masses or hepatosplenomegaly appreciated.  Well-healed incision. Extremities: Grossly normal range of motion.  Warm, well perfused.  No edema bilaterally. Skin: No rashes or lesions noted. Lymphatics: No cervical, supraclavicular, or inguinal adenopathy. GU: Normal appearing external genitalia without erythema, excoriation, or lesions.  Speculum exam reveals prolapse (green speculum used). Normal vaginal cuff.  Bimanual exam reveals smooth vaginal cuff. No nodularity or pelvic mass.  Rectovaginal exam negative. Exam chaperoned by Kimberly [place], CMA  Laboratory & Radiologic Studies: None

## 2022-05-16 NOTE — Patient Instructions (Signed)
It was a pleasure to see you in clinic today. - Everything looks good on exam - CT scan middle of June - Return visit planned for July  Thank you very much for allowing me to provide care for you today.  I appreciate your confidence in choosing our Gynecologic Oncology team at [facility].  If you have any questions about your visit today please call our office or send [place] a MyChart message and we will get back to you as soon as possible.

## 2022-05-18 ENCOUNTER — Encounter: Payer: Self-pay | Admitting: Psychiatry

## 2022-07-15 ENCOUNTER — Other Ambulatory Visit (HOSPITAL_COMMUNITY): Payer: Medicare HMO

## 2022-07-18 ENCOUNTER — Ambulatory Visit (HOSPITAL_COMMUNITY)
Admission: RE | Admit: 2022-07-18 | Discharge: 2022-07-18 | Disposition: A | Discharge status: Home / Self Care | Payer: Medicare HMO | Source: Ambulatory Visit | Attending: Psychiatry | Admitting: Psychiatry

## 2022-07-18 DIAGNOSIS — C541 Malignant neoplasm of endometrium: Secondary | ICD-10-CM | POA: Insufficient documentation

## 2022-07-18 MED ORDER — IOHEXOL 9 MG/ML PO SOLN
ORAL | Status: AC
  Filled 2022-07-18: qty 1000

## 2022-07-18 MED ORDER — IOHEXOL 9 MG/ML PO SOLN
1000.0000 mL | ORAL | Status: AC
  Administered 2022-07-18: 1000 mL via ORAL

## 2022-07-18 MED ORDER — IOHEXOL 300 MG/ML  SOLN
100.0000 mL | Freq: Once | INTRAMUSCULAR | Status: AC | PRN
  Administered 2022-07-18: 100 mL via INTRAVENOUS

## 2022-07-26 ENCOUNTER — Telehealth: Payer: Self-pay | Admitting: Psychiatry

## 2022-07-26 NOTE — Telephone Encounter (Signed)
Called pt with CT results. NED. Follow-up as scheduled.

## 2022-08-08 ENCOUNTER — Inpatient Hospital Stay: Payer: Medicare HMO | Attending: Psychiatry | Admitting: Psychiatry

## 2022-08-08 VITALS — BP 109/60 | HR 74 | Temp 97.4°F | Ht 62.44 in | Wt 170.2 lb

## 2022-08-08 DIAGNOSIS — Z90710 Acquired absence of both cervix and uterus: Secondary | ICD-10-CM | POA: Diagnosis not present

## 2022-08-08 DIAGNOSIS — C541 Malignant neoplasm of endometrium: Secondary | ICD-10-CM

## 2022-08-08 DIAGNOSIS — Z8542 Personal history of malignant neoplasm of other parts of uterus: Secondary | ICD-10-CM | POA: Diagnosis present

## 2022-08-08 DIAGNOSIS — Z90722 Acquired absence of ovaries, bilateral: Secondary | ICD-10-CM | POA: Diagnosis not present

## 2022-08-08 NOTE — Patient Instructions (Signed)
It was a pleasure to see you in clinic today. - Exam is normal today - Return visit planned for 3 months  Thank you very much for allowing me to provide care for you today.  I appreciate your confidence in choosing our Gynecologic Oncology team at [facility].  If you have any questions about your visit today please call our office or send [place] a MyChart message and we will get back to you as soon as possible.

## 2022-08-08 NOTE — Progress Notes (Signed)
Gynecologic Oncology Return Clinic Visit  Date of Service: 08/08/2022 Referring Provider: [name], MD   Assessment & Plan: April Lane is a 82 y.o. 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 She presents for surveillance.  Serous endometrial cancer: - NED on exam today - CT scan from 07/25/22 reviewed and NED. Repeat in 9mo (~01/2023) planned. Pt is considering deferring this. We can discuss further at next visit. - Pt declined adjuvant treatment. Discussed higher risk of recurrence. She feels that she has lived a good life and if she were to recur she would likely defer treatment and focus on symptom management hospice.  - Surveillance and signs/symptoms of recurrence reviewed.  - CA125 was not a marker for pt. - Recommend follow-up for surveillance q51months initially.   RTC 3 months .  Clide Cliff, MD Gynecologic Oncology   Medical Decision Making I personally spent  TOTAL 20 minutes face-to-face and non-face-to-face in the care of this patient, which includes all pre, intra, and post visit time on the date of service.   ----------------------- Reason for Visit: Surveillance  Treatment History: Oncology History Overview Note  High grade serous, MSI stable   Endometrial cancer (HCC)  11/15/2021 Imaging   Pelvic ultrasound: Noted a thickened and heterogeneous endometrium measuring 2 cm.  Uterus was also noted to have multiple small fibroids with the total uterus measuring 9.5 cm.     11/15/2021 Initial Diagnosis   She presented with PMB   12/17/2021 Initial Biopsy   High-grade endometrial carcinoma, p53 mutated, FIGO grade 3 Comment: The immunohistochemistry and morphology are most consistent with high-grade serous carcinomaPatient presented to her provider for postmenopausal bleeding   12/17/2021 Initial Diagnosis   Endometrial cancer (HCC)    01/18/2022 Pathology Results   FINAL MICROSCOPIC DIAGNOSIS:  A.   SENTINAL LYMPH NODE, LEFT, OBTURATOR: -    Lymph node with Mullerian inclusions. -    No metastatic malignancy identified (0/1)  B.   SENTINAL LYMPH NODE, RIGHT, OBTURATOR: -    Lymph node with no malignancy identified (0/1).  C.   LYMPH NODE, LEFT, UTEROSACRAL, RESECTION: -    Calcification. -    Fibrous tissue with benign Mullerian inclusions. -    No lymph node tissue identified. -    No malignancy identified.  D.   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  -    Cervix:             Negative for dysplasia and malignancy. Myometrium:         Leiomyomas. -    Ovaries:       Senescent ovaries, with no malignancy identified.                     Left ovary with benign serous inclusions. -     Fallopian tubes:   No malignancy identified. 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   ONCOLOGY TABLE:  UTERUS, CARCINOMA OR CARCINOSARCOMA: Resection  Procedure: Total hysterectomy and bilateral salpingo-oophorectomy Histologic Type: Serous carcinoma Histologic Grade: High grade Myometrial Invasion:      Depth of Myometrial Invasion (mm): 23 mm (slide D10)      Myometrial Thickness (mm): 26 mm (slide D10)      Percentage of Myometrial Invasion: 90% Uterine Serosa Involvement: Not identified Cervical stromal  Involvement: Not identified Extent of involvement of other tissue/organs: Not identified Peritoneal/Ascitic Fluid: Not known Lymphovascular Invasion: No definite lymphovascular invasion identified Regional Lymph Nodes: Regional lymph nodes present, all regional lymph nodes negative for tumor      Pelvic Lymph Nodes Examined: 2 Sentinel                                    0 Non-sentinel                                    2 Total      Pelvic Lymph Nodes  with Metastasis: Not applicable      Para-aortic Lymph Nodes Examined:  Not applicable Distant Metastasis:  Not applicable      Distant Site(s) Involved: Not applicable Pathologic Stage Classification (pTNM, AJCC 8th Edition): pT1b, pN0(sn) Ancillary Studies: MMR / MSI testing will be ordered Representative Tumor Block: D10    01/18/2022 Cancer Staging   Staging form: Corpus Uteri - Carcinoma and Carcinosarcoma, AJCC 8th Edition - Clinical stage from 01/18/2022: FIGO Stage IB (cT1b, cN0(sn), cM0) - Signed by Clide Cliff, MD on 02/11/2022 Histopathologic type: Serous cystadenocarcinoma, NOS Stage prefix: Initial diagnosis Method of lymph node assessment: Sentinel lymph node biopsy Histologic grade (G): G3 Histologic grading system: 3 grade system Lymph-vascular invasion (LVI): LVI not present (absent)/not identified   01/20/2022 Procedure   reoperative Diagnosis: Grade 3 endometrial cancer, p53 mutated, like serous   Postoperative Diagnosis: Same   Procedures: Robotic-assisted total laparoscopic hysterectomy, bilateral salpingo-oophorectomy, bilateral sentinel lymph node evaluation and biopsy   Surgeon: Clide Cliff, MD Findings: Normal upper abdominal survey with normal liver surface and diaphragm. Normal appearing small and large bowel. Ascending colon at the hepatic flexure adherent to the lateral abdominal wall.  Appendix visualized and adherent to the right lower quadrant.  Filmy adhesions of the rectosigmoid colon to the left lower abdominal wall, left pelvic sidewall, and cul-de-sac. Extensive diverticulosis present. Few vesicular lesions on the peritoneum of the posterior cul-de-sac that were removed en bloc with the uterine specimen, possible endosalpingosis.  Subcentimeter fibrotic appearing lesion on the left uterosacral ligament, excised.  Globular uterus. Bilateral fallopian tubes adherent to the ovarian fossas with left hydrosalpinx. Normal bilateral ovaries. Sentinel  mapping on right to the obturator space; sentinel mapping on left to the obturator space.       02/25/2022 - 02/25/2022 Chemotherapy   Patient is on Treatment Plan : UTERINE Carboplatin AUC 6 + Paclitaxel q21d       Interval History: She presents today with her family. She is doing well with no new complaints today. She denies any new vaginal bleeding, abdominal/pelvic pain, unintentional weight loss, change in bowel or bladder habits, early satiety, bloating, nausea/vomiting.    Past Medical/Surgical History: Past Medical History:  Diagnosis Date   Arthritis    Cataract    left eye immature   Diverticulitis    Endometrial cancer (HCC)    Fatigue    GI bleed    many yrs ago   History of blood transfusion    no abnormal reactions   HTN (hypertension)    takes Amlodipine and Losartan daily   Nausea with vomiting 12/01/2012   Osteoporosis    steroid injection right knee a few yrs ago   Peptic ulcer    Vitamin D deficiency  Past Surgical History:  Procedure Laterality Date   CHOLECYSTECTOMY N/A 01/14/2013   Procedure: LAPAROSCOPIC CHOLECYSTECTOMY WITH INTRAOPERATIVE CHOLANGIOGRAM;  Surgeon: Robyne Askew, MD;  Location: [facility] OR;  Service: General;  Laterality: N/A;   LYMPH NODE DISSECTION N/A 01/18/2022   Procedure: LYMPH NODE DISSECTION;  Surgeon: Clide Cliff, MD;  Location: [facility];  Service: Gynecology;  Laterality: N/A;   ROBOTIC ASSISTED TOTAL HYSTERECTOMY WITH BILATERAL SALPINGO OOPHERECTOMY Bilateral 01/18/2022   Procedure: XI ROBOTIC ASSISTED TOTAL HYSTERECTOMY WITH BILATERAL SALPINGO OOPHORECTOMY,POSSIBLE LAPARATOMY;  Surgeon: Clide Cliff, MD;  Location: [facility];  Service: Gynecology;  Laterality: Bilateral;   SENTINEL NODE BIOPSY N/A 01/18/2022   Procedure: SENTINEL NODE BIOPSY;  Surgeon: Clide Cliff, MD;  Location: [facility];  Service: Gynecology;  Laterality: N/A;   TUBAL LIGATION     at age 58    Family History  Problem Relation Age of Onset    Other Mother 22       Died of old age   Heart attack Father 66       Died of natural causes   Prostate cancer Brother    Breast cancer Neg Hx    Ovarian cancer Neg Hx    Uterine cancer Neg Hx    Colon cancer Neg Hx     Social History   Socioeconomic History   Marital status: Single    Spouse name: Not on file   Number of children: Not on file   Years of education: Not on file   Highest education level: Not on file  Occupational History   Occupation: CNA    Employer: OTHER  Tobacco Use   Smoking status: Never   Smokeless tobacco: Never  Vaping Use   Vaping Use: Never used  Substance and Sexual Activity   Alcohol use: No   Drug use: No   Sexual activity: Not on file  Other Topics Concern   Not on file  Social History Narrative   Not on file   Social Determinants of Health   Financial Resource Strain: Not on file  Food Insecurity: No Food Insecurity (01/18/2022)   Hunger Vital Sign    Worried About Running Out of Food in the Last Year: Never true    Ran Out of Food in the Last Year: Never true  Transportation Needs: No Transportation Needs (01/18/2022)   PRAPARE - Administrator, Civil Service (Medical): No    Lack of Transportation (Non-Medical): No  Physical Activity: Not on file  Stress: Not on file  Social Connections: Not on file    Current Medications:  Current Outpatient Medications:    acetaminophen (TYLENOL) 500 MG tablet, Take 500 mg by mouth every 6 (six) hours as needed for moderate pain., Disp: , Rfl:    amLODipine (NORVASC) 5 MG tablet, Take 5 mg by mouth daily., Disp: , Rfl:    Bee Pollen 1000 MG TABS, Take 2,000 mg by mouth daily., Disp: , Rfl:    Cholecalciferol (VITAMIN D) 50 MCG (2000 UT) tablet, Take 2,000 Units by mouth daily., Disp: , Rfl:    meclizine (ANTIVERT) 25 MG tablet, Take 25 mg by mouth 2 (two) times daily as needed (vertigo)., Disp: , Rfl: 1   olmesartan (BENICAR) 40 MG tablet, Take 40 mg by mouth daily., Disp: ,  Rfl:    simvastatin (ZOCOR) 20 MG tablet, Take 20 mg by mouth at bedtime., Disp: , Rfl: 3  Review of Symptoms: Complete 10-system review is positive for: none  Physical Exam: BP 109/60 (BP Location: Right Arm, Patient Position: Sitting)   Pulse 74   Temp (!) 97.4 F (36.3 C)   Ht 5' 2.44" (1.586 m)   Wt 170 lb 3.2 oz (77.2 kg)   SpO2 99%   BMI 30.69 kg/m  General: Alert, oriented, no acute distress. HEENT: Normocephalic, atraumatic. Neck symmetric without masses. Sclera anicteric.  Chest: Normal work of breathing. Clear to auscultation bilaterally.   Cardiovascular: Regular rate and rhythm, no murmurs. Abdomen: Soft, nontender.  Normoactive bowel sounds.  No masses or hepatosplenomegaly appreciated.  Well-healed incision. Extremities: Grossly normal range of motion.  Warm, well perfused.  No edema bilaterally. Skin: No rashes or lesions noted. Lymphatics: No cervical, supraclavicular, or inguinal adenopathy. GU: Normal appearing external genitalia without erythema, excoriation, or lesions.  Speculum exam reveals prolapse (green speculum used). Normal vaginal cuff.  Bimanual exam reveals smooth vaginal cuff. No nodularity or pelvic mass. Exam chaperoned by Andrey Cota, RN   Laboratory & Radiologic Studies: CT Abdomen Pelvis W Contrast 07/18/2022  Narrative CLINICAL DATA:  Endometrial cancer. Restaging. * Tracking Code: BO *  EXAM: CT ABDOMEN AND PELVIS WITH CONTRAST  TECHNIQUE: Multidetector CT imaging of the abdomen and pelvis was performed using the standard protocol following bolus administration of intravenous contrast.  RADIATION DOSE REDUCTION: This exam was performed according to the departmental dose-optimization program which includes automated exposure control, adjustment of the mA and/or kV according to patient size and/or use of iterative reconstruction technique.  CONTRAST:  OMNIPAQUE IOHEXOL 300 MG/ML  SOLN  COMPARISON:   01/07/2022  FINDINGS: Lower chest: Unremarkable.  Hepatobiliary: 7 mm focus of hyperenhancement in the lateral segment left liver (14/2) is stable in the interval 1.5 cm focus of hyperenhancement in the central right liver is also unchanged (19/2). No new suspicious abnormality in the liver parenchyma. Gallbladder surgically absent. No intrahepatic or extrahepatic biliary dilation.  Pancreas: No focal mass lesion. No dilatation of the main duct. No intraparenchymal cyst. No peripancreatic edema.  Spleen: No splenomegaly. No suspicious focal mass lesion.  Adrenals/Urinary Tract: No adrenal nodule or mass. Right kidney unremarkable. Tiny hypodensity in the interpolar left kidney is stable, likely a cyst. No followup imaging is recommended. No evidence for hydroureter. The urinary bladder appears normal for the degree of distention.  Stomach/Bowel: Small to moderate hiatal hernia. Stomach otherwise unremarkable. Duodenum is normally positioned as is the ligament of Treitz. No small bowel wall thickening. No small bowel dilatation. The terminal ileum is normal. The appendix is normal. No gross colonic mass. No colonic wall thickening. Diverticular changes are noted in the left colon without evidence of diverticulitis.  Vascular/Lymphatic: There is mild atherosclerotic calcification of the abdominal aorta without aneurysm. There is no gastrohepatic or hepatoduodenal ligament lymphadenopathy. No retroperitoneal or mesenteric lymphadenopathy. No pelvic sidewall lymphadenopathy.  Reproductive: Hysterectomy.  There is no adnexal mass.  Other: No intraperitoneal free fluid.  Musculoskeletal: Pelvic floor laxity evident. No worrisome lytic or sclerotic osseous abnormality.  IMPRESSION: 1. Interval hysterectomy with bilateral salpingo oophorectomy. No findings to suggest recurrent or metastatic disease on today's exam. 2. Stable small to moderate hiatal hernia. 3. Left colonic  diverticulosis without diverticulitis. 4.  Aortic Atherosclerosis (ICD10-I70.0).   Electronically Signed By: Kennith Center M.D. On: 07/25/2022 06:59

## 2022-08-14 ENCOUNTER — Encounter: Payer: Self-pay | Admitting: Psychiatry

## 2022-11-07 ENCOUNTER — Inpatient Hospital Stay: Payer: Medicare HMO | Attending: Psychiatry | Admitting: Psychiatry

## 2022-11-07 ENCOUNTER — Encounter: Payer: Self-pay | Admitting: Psychiatry

## 2022-11-07 VITALS — BP 130/61 | HR 80 | Temp 98.4°F | Resp 18 | Ht 62.44 in | Wt 168.0 lb

## 2022-11-07 DIAGNOSIS — Z90710 Acquired absence of both cervix and uterus: Secondary | ICD-10-CM | POA: Insufficient documentation

## 2022-11-07 DIAGNOSIS — Z90722 Acquired absence of ovaries, bilateral: Secondary | ICD-10-CM | POA: Insufficient documentation

## 2022-11-07 DIAGNOSIS — Z9079 Acquired absence of other genital organ(s): Secondary | ICD-10-CM | POA: Diagnosis not present

## 2022-11-07 DIAGNOSIS — Z8542 Personal history of malignant neoplasm of other parts of uterus: Secondary | ICD-10-CM | POA: Diagnosis not present

## 2022-11-07 DIAGNOSIS — C541 Malignant neoplasm of endometrium: Secondary | ICD-10-CM

## 2022-11-07 NOTE — Progress Notes (Signed)
Gynecologic Oncology Return Clinic Visit  Date of Service: 11/07/2022 Referring Provider: [name], MD   Assessment & Plan: April Lane is a 82 y.o. 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 She presents for surveillance.  Serous endometrial cancer: - NED on exam today - CT scan from 07/25/22 NED. -Reviewed that in the setting of neoadjuvant therapy and high risk of recurrence, would consider ongoing surveillance with CT scan.  However, patient has expressed that she would decline any future treatment if of recurrence were to occur.  Given this, we discussed deferring imaging unless she were to have symptoms.  Patient is in agreement with this and comfortable with this plan.  Imaging in the future would be done to guide discussion of prognosis and symptom management. - Surveillance and signs/symptoms of recurrence reviewed.  - CA125 was not a marker for pt. - Recommend follow-up for surveillance q2months initially.   RTC 3 months .  Clide Cliff, MD Gynecologic Oncology   Medical Decision Making I personally spent  TOTAL 20 minutes face-to-face and non-face-to-face in the care of this patient, which includes all pre, intra, and post visit time on the date of service.   ----------------------- Reason for Visit: Surveillance  Treatment History: Oncology History Overview Note  High grade serous, MSI stable   Endometrial cancer (HCC)  11/15/2021 Imaging   Pelvic ultrasound: Noted a thickened and heterogeneous endometrium measuring 2 cm.  Uterus was also noted to have multiple small fibroids with the total uterus measuring 9.5 cm.     11/15/2021 Initial Diagnosis   She presented with PMB   12/17/2021 Initial Biopsy   High-grade endometrial carcinoma, p53 mutated, FIGO grade 3 Comment: The immunohistochemistry and morphology are most consistent with high-grade  serous carcinomaPatient presented to her provider for postmenopausal bleeding   12/17/2021 Initial Diagnosis   Endometrial cancer (HCC)   01/18/2022 Pathology Results   FINAL MICROSCOPIC DIAGNOSIS:  A.   SENTINAL LYMPH NODE, LEFT, OBTURATOR: -    Lymph node with Mullerian inclusions. -    No metastatic malignancy identified (0/1)  B.   SENTINAL LYMPH NODE, RIGHT, OBTURATOR: -    Lymph node with no malignancy identified (0/1).  C.   LYMPH NODE, LEFT, UTEROSACRAL, RESECTION: -    Calcification. -    Fibrous tissue with benign Mullerian inclusions. -    No lymph node tissue identified. -    No malignancy identified.  D.   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  -    Cervix:             Negative for dysplasia and malignancy. Myometrium:         Leiomyomas. -    Ovaries:       Senescent ovaries, with no malignancy identified.                     Left ovary with benign serous inclusions. -     Fallopian tubes:   No malignancy identified. 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   ONCOLOGY TABLE:  UTERUS, CARCINOMA OR CARCINOSARCOMA: Resection  Procedure: Total hysterectomy and bilateral salpingo-oophorectomy Histologic Type: Serous carcinoma Histologic Grade: High grade Myometrial Invasion:      Depth of Myometrial Invasion (mm): 23 mm (slide D10)      Myometrial  Thickness (mm): 26 mm (slide D10)      Percentage of Myometrial Invasion: 90% Uterine Serosa Involvement: Not identified Cervical stromal Involvement: Not identified Extent of involvement of other tissue/organs: Not identified Peritoneal/Ascitic Fluid: Not known Lymphovascular Invasion: No definite lymphovascular invasion identified Regional Lymph Nodes: Regional lymph nodes present, all regional lymph nodes negative for tumor      Pelvic Lymph Nodes  Examined: 2 Sentinel                                    0 Non-sentinel                                    2 Total      Pelvic Lymph Nodes with Metastasis: Not applicable      Para-aortic Lymph Nodes Examined:  Not applicable Distant Metastasis:  Not applicable      Distant Site(s) Involved: Not applicable Pathologic Stage Classification (pTNM, AJCC 8th Edition): pT1b, pN0(sn) Ancillary Studies: MMR / MSI testing will be ordered Representative Tumor Block: D10    01/18/2022 Cancer Staging   Staging form: Corpus Uteri - Carcinoma and Carcinosarcoma, AJCC 8th Edition - Clinical stage from 01/18/2022: FIGO Stage IB (cT1b, cN0(sn), cM0) - Signed by Clide Cliff, MD on 02/11/2022 Histopathologic type: Serous cystadenocarcinoma, NOS Stage prefix: Initial diagnosis Method of lymph node assessment: Sentinel lymph node biopsy Histologic grade (G): G3 Histologic grading system: 3 grade system Lymph-vascular invasion (LVI): LVI not present (absent)/not identified   01/20/2022 Procedure   reoperative Diagnosis: Grade 3 endometrial cancer, p53 mutated, like serous   Postoperative Diagnosis: Same   Procedures: Robotic-assisted total laparoscopic hysterectomy, bilateral salpingo-oophorectomy, bilateral sentinel lymph node evaluation and biopsy   Surgeon: Clide Cliff, MD Findings: Normal upper abdominal survey with normal liver surface and diaphragm. Normal appearing small and large bowel. Ascending colon at the hepatic flexure adherent to the lateral abdominal wall.  Appendix visualized and adherent to the right lower quadrant.  Filmy adhesions of the rectosigmoid colon to the left lower abdominal wall, left pelvic sidewall, and cul-de-sac. Extensive diverticulosis present. Few vesicular lesions on the peritoneum of the posterior cul-de-sac that were removed en bloc with the uterine specimen, possible endosalpingosis.  Subcentimeter fibrotic appearing lesion on the left uterosacral ligament,  excised.  Globular uterus. Bilateral fallopian tubes adherent to the ovarian fossas with left hydrosalpinx. Normal bilateral ovaries. Sentinel mapping on right to the obturator space; sentinel mapping on left to the obturator space.       02/25/2022 - 02/25/2022 Chemotherapy   Patient is on Treatment Plan : UTERINE Carboplatin AUC 6 + Paclitaxel q21d       Interval History: Patient presents with her daughter today.  She reports overall doing well. She denies any new vaginal bleeding, abdominal/pelvic pain, unintentional weight loss, change in bowel or bladder habits, early satiety, bloating, nausea/vomiting.    Past Medical/Surgical History: Past Medical History:  Diagnosis Date   Arthritis    Cataract    left eye immature   Diverticulitis    Endometrial cancer (HCC)    Fatigue    GI bleed    many yrs ago   History of blood transfusion    no abnormal reactions   HTN (hypertension)    takes Amlodipine and Losartan daily   Nausea with vomiting 12/01/2012   Osteoporosis  steroid injection right knee a few yrs ago   Peptic ulcer    Vitamin D deficiency     Past Surgical History:  Procedure Laterality Date   CHOLECYSTECTOMY N/A 01/14/2013   Procedure: LAPAROSCOPIC CHOLECYSTECTOMY WITH INTRAOPERATIVE CHOLANGIOGRAM;  Surgeon: Robyne Askew, MD;  Location: [facility] OR;  Service: General;  Laterality: N/A;   LYMPH NODE DISSECTION N/A 01/18/2022   Procedure: LYMPH NODE DISSECTION;  Surgeon: Clide Cliff, MD;  Location: [facility] ORS;  Service: Gynecology;  Laterality: N/A;   ROBOTIC ASSISTED TOTAL HYSTERECTOMY WITH BILATERAL SALPINGO OOPHERECTOMY Bilateral 01/18/2022   Procedure: XI ROBOTIC ASSISTED TOTAL HYSTERECTOMY WITH BILATERAL SALPINGO OOPHORECTOMY,POSSIBLE LAPARATOMY;  Surgeon: Clide Cliff, MD;  Location: [facility] ORS;  Service: Gynecology;  Laterality: Bilateral;   SENTINEL NODE BIOPSY N/A 01/18/2022   Procedure: SENTINEL NODE BIOPSY;  Surgeon: Clide Cliff, MD;  Location: [facility]  ORS;  Service: Gynecology;  Laterality: N/A;   TUBAL LIGATION     at age 57    Family History  Problem Relation Age of Onset   Other Mother 65       Died of old age   Heart attack Father 24       Died of natural causes   Prostate cancer Brother    Breast cancer Neg Hx    Ovarian cancer Neg Hx    Uterine cancer Neg Hx    Colon cancer Neg Hx     Social History   Socioeconomic History   Marital status: Single    Spouse name: Not on file   Number of children: Not on file   Years of education: Not on file   Highest education level: Not on file  Occupational History   Occupation: CNA    Employer: OTHER  Tobacco Use   Smoking status: Never   Smokeless tobacco: Never  Vaping Use   Vaping status: Never Used  Substance and Sexual Activity   Alcohol use: No   Drug use: No   Sexual activity: Not on file  Other Topics Concern   Not on file  Social History Narrative   Not on file   Social Determinants of Health   Financial Resource Strain: Low Risk  (09/12/2022)   Received from [facility]   Overall Financial Resource Strain (CARDIA)    Difficulty of Paying Living Expenses: Not hard at all  Food Insecurity: No Food Insecurity (09/12/2022)   Received from [facility]   Hunger Vital Sign    Worried About Running Out of Food in the Last Year: Never true    Ran Out of Food in the Last Year: Never true  Transportation Needs: No Transportation Needs (09/12/2022)   Received from [facility] - Transportation    Lack of Transportation (Medical): No    Lack of Transportation (Non-Medical): No  Physical Activity: Sufficiently Active (09/12/2022)   Received from [facility]   Exercise Vital Sign    Days of Exercise per Week: 6 days    Minutes of Exercise per Session: 30 min  Stress: No Stress Concern Present (03/14/2022)   Received from [facility] - Occupational Stress Questionnaire    Feeling of Stress :  Not at all  Social Connections: Socially Integrated (03/14/2022)   Received from [facility]   Social Network    How would you rate your social network (family, work, friends)?: Good participation with social networks    Current Medications:  Current  Outpatient Medications:    acetaminophen (TYLENOL) 500 MG tablet, Take 500 mg by mouth every 6 (six) hours as needed for moderate pain., Disp: , Rfl:    amLODipine (NORVASC) 5 MG tablet, Take 5 mg by mouth daily., Disp: , Rfl:    Bee Pollen 1000 MG TABS, Take 2,000 mg by mouth daily., Disp: , Rfl:    Cholecalciferol (VITAMIN D) 50 MCG (2000 UT) tablet, Take 2,000 Units by mouth daily., Disp: , Rfl:    meclizine (ANTIVERT) 25 MG tablet, Take 25 mg by mouth 2 (two) times daily as needed (vertigo)., Disp: , Rfl: 1   olmesartan (BENICAR) 40 MG tablet, Take 40 mg by mouth daily., Disp: , Rfl:    simvastatin (ZOCOR) 20 MG tablet, Take 20 mg by mouth at bedtime., Disp: , Rfl: 3  Review of Symptoms: Complete 10-system review is positive for: none  Physical Exam: BP 130/61 (BP Location: Left Arm, Patient Position: Sitting)   Pulse 80   Temp 98.4 F (36.9 C)   Resp 18   Ht 5' 2.44" (1.586 m)   Wt 168 lb (76.2 kg)   SpO2 99%   BMI 30.30 kg/m  General: Alert, oriented, no acute distress. HEENT: Normocephalic, atraumatic. Neck symmetric without masses. Sclera anicteric.  Chest: Normal work of breathing. Clear to auscultation bilaterally.   Cardiovascular: Regular rate and rhythm, no murmurs. Abdomen: Soft, nontender.  Normoactive bowel sounds.  No masses or hepatosplenomegaly appreciated.  Well-healed incision. Extremities: Grossly normal range of motion.  Warm, well perfused.  No edema bilaterally. Skin: No rashes or lesions noted. Lymphatics: No cervical, supraclavicular, or inguinal adenopathy. GU: Normal appearing external genitalia without erythema, excoriation, or lesions.  Speculum exam reveals prolapse (green speculum  used). Normal vaginal cuff.  Bimanual exam reveals smooth vaginal cuff. No nodularity or pelvic mass. Exam chaperoned by Warner Mccreedy, NP   Laboratory & Radiologic Studies: none

## 2022-11-07 NOTE — Patient Instructions (Signed)
It was a pleasure to see you in clinic today. - Exam normal today - Return visit planned for 3 months. If things continue to look good, we may be able to space out follow-ups at that time.  Thank you very much for allowing me to provide care for you today.  I appreciate your confidence in choosing our Gynecologic Oncology team at [facility].  If you have any questions about your visit today please call our office or send [place] a MyChart message and we will get back to you as soon as possible.

## 2022-12-04 ENCOUNTER — Other Ambulatory Visit: Payer: Self-pay | Admitting: Gynecologic Oncology

## 2022-12-04 DIAGNOSIS — C541 Malignant neoplasm of endometrium: Secondary | ICD-10-CM

## 2022-12-29 ENCOUNTER — Telehealth: Payer: Self-pay

## 2022-12-29 NOTE — Telephone Encounter (Signed)
Pt's daughter, Louanne Belton, called stating Ms.Derner is scheduled for a follow up with Dr.Newton on 12/30. She would like to reschedule that appointment to an earlier date in December due to a scheduling conflict.   New date and time is 12/16 @ 4:00. Dionne agrees to date and time.

## 2023-01-23 ENCOUNTER — Inpatient Hospital Stay: Payer: Medicare HMO | Attending: Psychiatry | Admitting: Psychiatry

## 2023-01-23 ENCOUNTER — Encounter: Payer: Self-pay | Admitting: Psychiatry

## 2023-01-23 VITALS — BP 111/66 | HR 84 | Temp 98.5°F | Resp 20 | Wt 164.4 lb

## 2023-01-23 DIAGNOSIS — Z90710 Acquired absence of both cervix and uterus: Secondary | ICD-10-CM | POA: Insufficient documentation

## 2023-01-23 DIAGNOSIS — Z9079 Acquired absence of other genital organ(s): Secondary | ICD-10-CM | POA: Diagnosis not present

## 2023-01-23 DIAGNOSIS — Z8542 Personal history of malignant neoplasm of other parts of uterus: Secondary | ICD-10-CM | POA: Insufficient documentation

## 2023-01-23 DIAGNOSIS — C541 Malignant neoplasm of endometrium: Secondary | ICD-10-CM | POA: Diagnosis not present

## 2023-01-23 DIAGNOSIS — Z90722 Acquired absence of ovaries, bilateral: Secondary | ICD-10-CM | POA: Insufficient documentation

## 2023-01-23 NOTE — Patient Instructions (Signed)
It was a pleasure to see you in clinic today. - Exam normal today - If any changes, let us know and we will see you back sooner. - Return visit planned for 6 months  Thank you very much for allowing me to provide care for you today.  I appreciate your confidence in choosing our Gynecologic Oncology team at [facility].  If you have any questions about your visit today please call our office or send [place] a MyChart message and we will get back to you as soon as possible.

## 2023-01-23 NOTE — Progress Notes (Signed)
Gynecologic Oncology Return Clinic Visit  Date of Service: 01/23/2023 Referring Provider: [name], MD   Assessment & Plan: April Lane is a 82 y.o. 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 She presents for surveillance.  Serous endometrial cancer: - NED on exam today - CT scan from 07/25/22 NED. -Previously discussed that in the setting of neoadjuvant therapy and high risk of recurrence, would consider ongoing surveillance with CT scan.  However, patient has expressed that she would decline any future treatment if of recurrence were to occur.  Given this, we discussed deferring imaging unless she were to have symptoms.  Patient is in agreement with this and comfortable with this plan.   - Imaging in the future would be done to guide discussion of prognosis and symptom management. - Surveillance and signs/symptoms of recurrence reviewed.  - CA125 was not a marker for pt. - Discussed q68mo versus q55mo surveillance given 1 year NED and pt does not desire systemic treatment. Pt opts for q72mo follow-up but will return sooner if new symptom were to arise.   RTC 6 months .  Clide Cliff, MD Gynecologic Oncology   Medical Decision Making I personally spent  TOTAL 15 minutes face-to-face and non-face-to-face in the care of this patient, which includes all pre, intra, and post visit time on the date of service.   ----------------------- Reason for Visit: Surveillance  Treatment History: Oncology History Overview Note  High grade serous, MSI stable   Endometrial cancer (HCC)  11/15/2021 Imaging   Pelvic ultrasound: Noted a thickened and heterogeneous endometrium measuring 2 cm.  Uterus was also noted to have multiple small fibroids with the total uterus measuring 9.5 cm.     11/15/2021 Initial Diagnosis   She presented with PMB   12/17/2021 Initial Biopsy    High-grade endometrial carcinoma, p53 mutated, FIGO grade 3 Comment: The immunohistochemistry and morphology are most consistent with high-grade serous carcinomaPatient presented to her provider for postmenopausal bleeding   12/17/2021 Initial Diagnosis   Endometrial cancer (HCC)   01/18/2022 Pathology Results   FINAL MICROSCOPIC DIAGNOSIS:  A.   SENTINAL LYMPH NODE, LEFT, OBTURATOR: -    Lymph node with Mullerian inclusions. -    No metastatic malignancy identified (0/1)  B.   SENTINAL LYMPH NODE, RIGHT, OBTURATOR: -    Lymph node with no malignancy identified (0/1).  C.   LYMPH NODE, LEFT, UTEROSACRAL, RESECTION: -    Calcification. -    Fibrous tissue with benign Mullerian inclusions. -    No lymph node tissue identified. -    No malignancy identified.  D.   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  -    Cervix:             Negative for dysplasia and malignancy. Myometrium:         Leiomyomas. -    Ovaries:       Senescent ovaries, with no malignancy identified.                     Left ovary with benign serous inclusions. -     Fallopian tubes:   No malignancy identified. 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   ONCOLOGY TABLE:  UTERUS, CARCINOMA OR CARCINOSARCOMA: Resection  Procedure: Total hysterectomy and bilateral salpingo-oophorectomy Histologic Type:  Serous carcinoma Histologic Grade: High grade Myometrial Invasion:      Depth of Myometrial Invasion (mm): 23 mm (slide D10)      Myometrial Thickness (mm): 26 mm (slide D10)      Percentage of Myometrial Invasion: 90% Uterine Serosa Involvement: Not identified Cervical stromal Involvement: Not identified Extent of involvement of other tissue/organs: Not identified Peritoneal/Ascitic Fluid: Not known Lymphovascular Invasion: No definite  lymphovascular invasion identified Regional Lymph Nodes: Regional lymph nodes present, all regional lymph nodes negative for tumor      Pelvic Lymph Nodes Examined: 2 Sentinel                                    0 Non-sentinel                                    2 Total      Pelvic Lymph Nodes with Metastasis: Not applicable      Para-aortic Lymph Nodes Examined:  Not applicable Distant Metastasis:  Not applicable      Distant Site(s) Involved: Not applicable Pathologic Stage Classification (pTNM, AJCC 8th Edition): pT1b, pN0(sn) Ancillary Studies: MMR / MSI testing will be ordered Representative Tumor Block: D10    01/18/2022 Cancer Staging   Staging form: Corpus Uteri - Carcinoma and Carcinosarcoma, AJCC 8th Edition - Clinical stage from 01/18/2022: FIGO Stage IB (cT1b, cN0(sn), cM0) - Signed by Clide Cliff, MD on 02/11/2022 Histopathologic type: Serous cystadenocarcinoma, NOS Stage prefix: Initial diagnosis Method of lymph node assessment: Sentinel lymph node biopsy Histologic grade (G): G3 Histologic grading system: 3 grade system Lymph-vascular invasion (LVI): LVI not present (absent)/not identified   01/20/2022 Procedure   reoperative Diagnosis: Grade 3 endometrial cancer, p53 mutated, like serous   Postoperative Diagnosis: Same   Procedures: Robotic-assisted total laparoscopic hysterectomy, bilateral salpingo-oophorectomy, bilateral sentinel lymph node evaluation and biopsy   Surgeon: Clide Cliff, MD Findings: Normal upper abdominal survey with normal liver surface and diaphragm. Normal appearing small and large bowel. Ascending colon at the hepatic flexure adherent to the lateral abdominal wall.  Appendix visualized and adherent to the right lower quadrant.  Filmy adhesions of the rectosigmoid colon to the left lower abdominal wall, left pelvic sidewall, and cul-de-sac. Extensive diverticulosis present. Few vesicular lesions on the peritoneum of the posterior cul-de-sac  that were removed en bloc with the uterine specimen, possible endosalpingosis.  Subcentimeter fibrotic appearing lesion on the left uterosacral ligament, excised.  Globular uterus. Bilateral fallopian tubes adherent to the ovarian fossas with left hydrosalpinx. Normal bilateral ovaries. Sentinel mapping on right to the obturator space; sentinel mapping on left to the obturator space.       02/25/2022 - 02/25/2022 Chemotherapy   Patient is on Treatment Plan : UTERINE Carboplatin AUC 6 + Paclitaxel q21d       Interval History: Overall doing well. She denies any new vaginal bleeding, abdominal/pelvic pain, unintentional weight loss, change in bowel or bladder habits, early satiety, bloating, nausea/vomiting.    Past Medical/Surgical History: Past Medical History:  Diagnosis Date   Arthritis    Cataract    left eye immature   Diverticulitis    Endometrial cancer (HCC)    Fatigue    GI bleed    many yrs ago   History of blood transfusion    no abnormal reactions   HTN (hypertension)  takes Amlodipine and Losartan daily   Nausea with vomiting 12/01/2012   Osteoporosis    steroid injection right knee a few yrs ago   Peptic ulcer    Vitamin D deficiency     Past Surgical History:  Procedure Laterality Date   CHOLECYSTECTOMY N/A 01/14/2013   Procedure: LAPAROSCOPIC CHOLECYSTECTOMY WITH INTRAOPERATIVE CHOLANGIOGRAM;  Surgeon: Robyne Askew, MD;  Location: [facility] OR;  Service: General;  Laterality: N/A;   LYMPH NODE DISSECTION N/A 01/18/2022   Procedure: LYMPH NODE DISSECTION;  Surgeon: Clide Cliff, MD;  Location: [facility];  Service: Gynecology;  Laterality: N/A;   ROBOTIC ASSISTED TOTAL HYSTERECTOMY WITH BILATERAL SALPINGO OOPHERECTOMY Bilateral 01/18/2022   Procedure: XI ROBOTIC ASSISTED TOTAL HYSTERECTOMY WITH BILATERAL SALPINGO OOPHORECTOMY,POSSIBLE LAPARATOMY;  Surgeon: Clide Cliff, MD;  Location: [facility];  Service: Gynecology;  Laterality: Bilateral;   SENTINEL NODE BIOPSY  N/A 01/18/2022   Procedure: SENTINEL NODE BIOPSY;  Surgeon: Clide Cliff, MD;  Location: [facility];  Service: Gynecology;  Laterality: N/A;   TUBAL LIGATION     at age 54    Family History  Problem Relation Age of Onset   Other Mother 17       Died of old age   Heart attack Father 62       Died of natural causes   Prostate cancer Brother    Breast cancer Neg Hx    Ovarian cancer Neg Hx    Uterine cancer Neg Hx    Colon cancer Neg Hx     Social History   Socioeconomic History   Marital status: Single    Spouse name: Not on file   Number of children: Not on file   Years of education: Not on file   Highest education level: Not on file  Occupational History   Occupation: CNA    Employer: OTHER  Tobacco Use   Smoking status: Never   Smokeless tobacco: Never  Vaping Use   Vaping status: Never Used  Substance and Sexual Activity   Alcohol use: No   Drug use: No   Sexual activity: Not on file  Other Topics Concern   Not on file  Social History Narrative   Not on file   Social Drivers of Health   Financial Resource Strain: Low Risk  (09/12/2022)   Received [facility]   Overall Financial Resource Strain (CARDIA)    Difficulty of Paying Living Expenses: Not hard at all  Food Insecurity: No Food Insecurity (09/12/2022)   Received from [facility]   Hunger Vital Sign    Worried About Running Out of Food in the Last Year: Never true    Ran Out of Food in the Last Year: Never true  Transportation Needs: No Transportation Needs (09/12/2022)   Received from [facility] - Transportation    Lack of Transportation (Medical): No    Lack of Transportation (Non-Medical): No  Physical Activity: Sufficiently Active (09/12/2022)   Received from [facility]   Exercise Vital Sign    Days of Exercise per Week: 6 days    Minutes of Exercise per Session: 30 min  Stress: No Stress Concern Present (03/14/2022)   Received from [facility], [facility] - Occupational Stress Questionnaire    Feeling of Stress : Not at all  Social Connections: Socially Integrated (03/14/2022)   Received from [facility], [facility]   Social Network    How would you rate your  social network (family, work, friends)?: Good participation with social networks    Current Medications:  Current Outpatient Medications:    acetaminophen (TYLENOL) 500 MG tablet, Take 500 mg by mouth every 6 (six) hours as needed for moderate pain., Disp: , Rfl:    amLODipine (NORVASC) 5 MG tablet, Take 5 mg by mouth daily., Disp: , Rfl:    Bee Pollen 1000 MG TABS, Take 2,000 mg by mouth daily., Disp: , Rfl:    Cholecalciferol (VITAMIN D) 50 MCG (2000 UT) tablet, Take 2,000 Units by mouth daily., Disp: , Rfl:    meclizine (ANTIVERT) 25 MG tablet, Take 25 mg by mouth 2 (two) times daily as needed (vertigo)., Disp: , Rfl: 1   olmesartan (BENICAR) 40 MG tablet, Take 40 mg by mouth daily., Disp: , Rfl:    simvastatin (ZOCOR) 20 MG tablet, Take 20 mg by mouth at bedtime., Disp: , Rfl: 3  Review of Symptoms: Complete 10-system review is positive for: none  Physical Exam: BP 111/66 (BP Location: Left Arm, Patient Position: Sitting)   Pulse 84   Temp 98.5 F (36.9 C) (Oral)   Resp 20   Wt 164 lb 6.4 oz (74.6 kg)   SpO2 99%   BMI 29.65 kg/m  General: Alert, oriented, no acute distress. HEENT: Normocephalic, atraumatic. Neck symmetric without masses. Sclera anicteric.  Chest: Normal work of breathing. Clear to auscultation bilaterally.   Cardiovascular: Regular rate and rhythm, no murmurs. Abdomen: Soft, nontender.  Normoactive bowel sounds.  No masses or hepatosplenomegaly appreciated.  Well-healed incision. Extremities: Grossly normal range of motion.  Warm, well perfused.  No edema bilaterally. Skin: No rashes or lesions noted. Lymphatics: No cervical, supraclavicular, or inguinal adenopathy. GU: Normal appearing external genitalia without  erythema, excoriation, or lesions.  Speculum exam reveals prolapse (green speculum used). Normal vaginal cuff.  Bimanual exam reveals smooth vaginal cuff. No nodularity or pelvic mass. Exam chaperoned by Kimberly [place]az[place], CMA   Laboratory & Radiologic Studies: None

## 2023-01-26 LAB — MOLECULAR PATHOLOGY

## 2023-02-06 ENCOUNTER — Ambulatory Visit: Payer: Medicare HMO | Admitting: Psychiatry

## 2023-04-24 ENCOUNTER — Ambulatory Visit: Payer: Medicare HMO | Admitting: Psychiatry

## 2023-07-24 ENCOUNTER — Inpatient Hospital Stay: Payer: Medicare HMO | Attending: Psychiatry | Admitting: Psychiatry

## 2023-07-24 ENCOUNTER — Encounter: Payer: Self-pay | Admitting: Psychiatry

## 2023-07-24 VITALS — BP 119/65 | HR 81 | Temp 98.2°F | Resp 16 | Ht 64.0 in | Wt 168.8 lb

## 2023-07-24 DIAGNOSIS — C541 Malignant neoplasm of endometrium: Secondary | ICD-10-CM

## 2023-07-24 DIAGNOSIS — Z90710 Acquired absence of both cervix and uterus: Secondary | ICD-10-CM | POA: Diagnosis not present

## 2023-07-24 DIAGNOSIS — Z9079 Acquired absence of other genital organ(s): Secondary | ICD-10-CM | POA: Diagnosis not present

## 2023-07-24 DIAGNOSIS — Z8542 Personal history of malignant neoplasm of other parts of uterus: Secondary | ICD-10-CM | POA: Insufficient documentation

## 2023-07-24 DIAGNOSIS — Z90722 Acquired absence of ovaries, bilateral: Secondary | ICD-10-CM | POA: Insufficient documentation

## 2023-07-24 NOTE — Patient Instructions (Signed)
 It was a pleasure to see you in clinic today. - You are doing well. - Return visit planned for 6 mo  Thank you very much for allowing me to provide care for you today.  I appreciate your confidence in choosing our Gynecologic Oncology team at [facility].  If you have any questions about your visit today please call our office or send us  a MyChart message and we will get back to you as soon as possible.

## 2023-07-24 NOTE — Progress Notes (Signed)
 Gynecologic Oncology Return Clinic Visit  Date of Service: 07/24/2023 Referring Provider: Cathryn Cobb, MD   Assessment & Plan: April Lane is a 83 y.o. 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 She presents for surveillance.  Serous endometrial cancer: - NED on exam today. Bluish discoloration to the vaginal cuff that I suspect is due to thinning of tissue here, no lesions or nodularity. Will follow. - CT scan from 07/25/22 NED. -Previously discussed that in the setting of neoadjuvant therapy and high risk of recurrence, would consider ongoing surveillance with CT scan.  However, patient has expressed that she would decline any future treatment if of recurrence were to occur.  Given this, we discussed deferring imaging unless she were to have symptoms.  Patient is in agreement with this and comfortable with this plan.   - Imaging in the future would be done to guide discussion of prognosis and symptom management. - Surveillance and signs/symptoms of recurrence reviewed.  - CA125 was not a marker for pt. - Continue q49mo surveillance   RTC 6 months .  Derrel Flies, MD Gynecologic Oncology   Medical Decision Making I personally spent  TOTAL 15 minutes face-to-face and non-face-to-face in the care of this patient, which includes all pre, intra, and post visit time on the date of service.   ----------------------- Reason for Visit: Surveillance  Treatment History: Oncology History Overview Note  High grade serous, MSI stable   Endometrial cancer (HCC)  11/15/2021 Imaging   Pelvic ultrasound: Noted a thickened and heterogeneous endometrium measuring 2 cm.  Uterus was also noted to have multiple small fibroids with the total uterus measuring 9.5 cm.     11/15/2021 Initial Diagnosis   She presented with PMB   12/17/2021 Initial Biopsy   High-grade endometrial  carcinoma, p53 mutated, FIGO grade 3 Comment: The immunohistochemistry and morphology are most consistent with high-grade serous carcinomaPatient presented to her provider for postmenopausal bleeding   12/17/2021 Initial Diagnosis   Endometrial cancer (HCC)   01/18/2022 Pathology Results   FINAL MICROSCOPIC DIAGNOSIS:  A.   SENTINAL LYMPH NODE, LEFT, OBTURATOR: -    Lymph node with Mullerian inclusions. -    No metastatic malignancy identified (0/1)  B.   SENTINAL LYMPH NODE, RIGHT, OBTURATOR: -    Lymph node with no malignancy identified (0/1).  C.   LYMPH NODE, LEFT, UTEROSACRAL, RESECTION: -    Calcification. -    Fibrous tissue with benign Mullerian inclusions. -    No lymph node tissue identified. -    No malignancy identified.  D.   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  -    Cervix:             Negative for dysplasia and malignancy. Myometrium:         Leiomyomas. -    Ovaries:       Senescent ovaries, with no malignancy identified.                     Left ovary with benign serous inclusions. -     Fallopian tubes:   No malignancy identified. 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   ONCOLOGY TABLE:  UTERUS, CARCINOMA OR CARCINOSARCOMA: Resection  Procedure: Total hysterectomy and bilateral salpingo-oophorectomy Histologic Type: Serous carcinoma Histologic Grade: High grade  Myometrial Invasion:      Depth of Myometrial Invasion (mm): 23 mm (slide D10)      Myometrial Thickness (mm): 26 mm (slide D10)      Percentage of Myometrial Invasion: 90% Uterine Serosa Involvement: Not identified Cervical stromal Involvement: Not identified Extent of involvement of other tissue/organs: Not identified Peritoneal/Ascitic Fluid: Not known Lymphovascular Invasion: No definite lymphovascular invasion  identified Regional Lymph Nodes: Regional lymph nodes present, all regional lymph nodes negative for tumor      Pelvic Lymph Nodes Examined: 2 Sentinel                                    0 Non-sentinel                                    2 Total      Pelvic Lymph Nodes with Metastasis: Not applicable      Para-aortic Lymph Nodes Examined:  Not applicable Distant Metastasis:  Not applicable      Distant Site(s) Involved: Not applicable Pathologic Stage Classification (pTNM, AJCC 8th Edition): pT1b, pN0(sn) Ancillary Studies: MMR / MSI testing will be ordered Representative Tumor Block: D10    01/18/2022 Cancer Staging   Staging form: Corpus Uteri - Carcinoma and Carcinosarcoma, AJCC 8th Edition - Clinical stage from 01/18/2022: FIGO Stage IB (cT1b, cN0(sn), cM0) - Signed by Derrel Flies, MD on 02/11/2022 Histopathologic type: Serous cystadenocarcinoma, NOS Stage prefix: Initial diagnosis Method of lymph node assessment: Sentinel lymph node biopsy Histologic grade (G): G3 Histologic grading system: 3 grade system Lymph-vascular invasion (LVI): LVI not present (absent)/not identified   01/20/2022 Procedure   reoperative Diagnosis: Grade 3 endometrial cancer, p53 mutated, like serous   Postoperative Diagnosis: Same   Procedures: Robotic-assisted total laparoscopic hysterectomy, bilateral salpingo-oophorectomy, bilateral sentinel lymph node evaluation and biopsy   Surgeon: Derrel Flies, MD Findings: Normal upper abdominal survey with normal liver surface and diaphragm. Normal appearing small and large bowel. Ascending colon at the hepatic flexure adherent to the lateral abdominal wall.  Appendix visualized and adherent to the right lower quadrant.  Filmy adhesions of the rectosigmoid colon to the left lower abdominal wall, left pelvic sidewall, and cul-de-sac. Extensive diverticulosis present. Few vesicular lesions on the peritoneum of the posterior cul-de-sac that were removed en  bloc with the uterine specimen, possible endosalpingosis.  Subcentimeter fibrotic appearing lesion on the left uterosacral ligament, excised.  Globular uterus. Bilateral fallopian tubes adherent to the ovarian fossas with left hydrosalpinx. Normal bilateral ovaries. Sentinel mapping on right to the obturator space; sentinel mapping on left to the obturator space.       02/25/2022 - 02/25/2022 Chemotherapy   Patient is on Treatment Plan : UTERINE Carboplatin AUC 6 + Paclitaxel q21d       Interval History: Overall doing well. She denies any new vaginal bleeding, abdominal/pelvic pain, unintentional weight loss, change in bowel or bladder habits, early satiety, bloating, nausea/vomiting. Walks daily when the weather permits.    Past Medical/Surgical History: Past Medical History:  Diagnosis Date   Arthritis    Cataract    left eye immature   Diverticulitis    Endometrial cancer (HCC)    Fatigue    GI bleed    many yrs ago   History of blood transfusion    no abnormal reactions   HTN (hypertension)  takes Amlodipine  and Losartan  daily   Nausea with vomiting 12/01/2012   Osteoporosis    steroid injection right knee a few yrs ago   Peptic ulcer    Vitamin D deficiency     Past Surgical History:  Procedure Laterality Date   CHOLECYSTECTOMY N/A 01/14/2013   Procedure: LAPAROSCOPIC CHOLECYSTECTOMY WITH INTRAOPERATIVE CHOLANGIOGRAM;  Surgeon: Mayme Spearman, MD;  Location: [facility] OR;  Service: General;  Laterality: N/A;   LYMPH NODE DISSECTION N/A 01/18/2022   Procedure: LYMPH NODE DISSECTION;  Surgeon: Derrel Flies, MD;  Location: [facility];  Service: Gynecology;  Laterality: N/A;   ROBOTIC ASSISTED TOTAL HYSTERECTOMY WITH BILATERAL SALPINGO OOPHERECTOMY Bilateral 01/18/2022   Procedure: XI ROBOTIC ASSISTED TOTAL HYSTERECTOMY WITH BILATERAL SALPINGO OOPHORECTOMY,POSSIBLE LAPARATOMY;  Surgeon: Derrel Flies, MD;  Location: [facility];  Service: Gynecology;  Laterality: Bilateral;    SENTINEL NODE BIOPSY N/A 01/18/2022   Procedure: SENTINEL NODE BIOPSY;  Surgeon: Derrel Flies, MD;  Location: [facility];  Service: Gynecology;  Laterality: N/A;   TUBAL LIGATION     at age 78    Family History  Problem Relation Age of Onset   Other Mother 45       Died of old age   Heart attack Father 48       Died of natural causes   Prostate cancer Brother    Breast cancer Neg Hx    Ovarian cancer Neg Hx    Uterine cancer Neg Hx    Colon cancer Neg Hx     Social History   Socioeconomic History   Marital status: Single    Spouse name: Not on file   Number of children: Not on file   Years of education: Not on file   Highest education level: Not on file  Occupational History   Occupation: CNA    Employer: OTHER  Tobacco Use   Smoking status: Never   Smokeless tobacco: Never  Vaping Use   Vaping status: Never Used  Substance and Sexual Activity   Alcohol use: No   Drug use: No   Sexual activity: Not on file  Other Topics Concern   Not on file  Social History Narrative   Not on file   Social Drivers of Health   Financial Resource Strain: Low Risk  (03/15/2023)   Received from [facility]   Overall Financial Resource Strain (CARDIA)    Difficulty of Paying Living Expenses: Not hard at all  Food Insecurity: No Food Insecurity (03/15/2023)   Received from [facility]   Hunger Vital Sign    Within the past 12 months, you worried that your food would run out before you got the money to buy more.: Never true    Within the past 12 months, the food you bought just didn't last and you didn't have money to get more.: Never true  Transportation Needs: No Transportation Needs (03/15/2023)   Received from [facility] - Transportation    Lack of Transportation (Medical): No    Lack of Transportation (Non-Medical): No  Physical Activity: Inactive (03/15/2023)   Received from [facility]   Exercise Vital Sign    On average, how many days per week do you engage  in moderate to strenuous exercise (like a brisk walk)?: 0 days    On average, how many minutes do you engage in exercise at this level?: 30 min  Stress: No Stress Concern Present (03/15/2023)   Received from [facility] of  Occupational Health - Occupational Stress Questionnaire    Feeling of Stress : Not at all  Social Connections: Socially Integrated (03/15/2023)   Received from [facility]   Social Network    How would you rate your social network (family, work, friends)?: Good participation with social networks    Current Medications:  Current Outpatient Medications:    acetaminophen  (TYLENOL ) 500 MG tablet, Take 500 mg by mouth every 6 (six) hours as needed for moderate pain., Disp: , Rfl:    amLODipine  (NORVASC ) 5 MG tablet, Take 5 mg by mouth daily., Disp: , Rfl:    Bee Pollen 1000 MG TABS, Take 2,000 mg by mouth daily., Disp: , Rfl:    Cholecalciferol (VITAMIN D) 50 MCG (2000 UT) tablet, Take 2,000 Units by mouth daily., Disp: , Rfl:    meclizine  (ANTIVERT ) 25 MG tablet, Take 25 mg by mouth 2 (two) times daily as needed (vertigo)., Disp: , Rfl: 1   olmesartan (BENICAR) 40 MG tablet, Take 40 mg by mouth daily., Disp: , Rfl:    simvastatin  (ZOCOR ) 20 MG tablet, Take 20 mg by mouth at bedtime., Disp: , Rfl: 3  Review of Symptoms: Complete 10-system review is positive for: none  Physical Exam: BP 119/65 (BP Location: Left Arm, Patient Position: Sitting)   Pulse 81   Temp 98.2 F (36.8 C) (Oral)   Resp 16   Ht 5' 4 (1.626 m)   Wt 168 lb 12.8 oz (76.6 kg)   SpO2 99%   BMI 28.97 kg/m  General: Alert, oriented, no acute distress. HEENT: Normocephalic, atraumatic. Neck symmetric without masses. Sclera anicteric.  Chest: Normal work of breathing. Clear to auscultation bilaterally.   Cardiovascular: Regular rate and rhythm, no murmurs. Abdomen: Soft, nontender.  Normoactive bowel sounds.  No masses or hepatosplenomegaly appreciated.  Well-healed  incision. Extremities: Grossly normal range of motion.  Warm, well perfused.  No edema bilaterally. Skin: No rashes or lesions noted. Lymphatics: No cervical, supraclavicular, or inguinal adenopathy. GU: Normal appearing external genitalia without erythema, excoriation, or lesions.  Speculum exam reveals prolapse (green speculum used). Cuff smooth but with bluish discoloration at apex. No raised lesions. Bimanual exam reveals smooth vaginal cuff. No nodularity or pelvic mass. Exam chaperoned by Kimberly [place]az[place], CMA    Laboratory & Radiologic Studies: None

## 2023-12-11 NOTE — Progress Notes (Signed)
 12/11/2023   [facility]     Impression/ Plan    1. Annual physical exam  Lipid Panel    2. Benign essential hypertension  CBC And Differential   Comprehensive Metabolic Panel    3. Unspecified vitamin D deficiency  Vitamin D 25 Hydroxy    4. Primary hyperparathyroidism (*)      5. 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 Routine checkup: - Blood pressure readings are within the normal range, indicating effective management with current medication regimen. - A comprehensive blood panel will be ordered today. The necessary paperwork for a handicap sticker has been completed and signed off. - A refill for amlodipine  will be provided.  Follow-up: The patient will follow up in February 2026 for a wellness visit.    Patient instructions and education given as below.  Labs ordered today and I will follow up with the patient with any additional recommendations once the results are available.    Healthy lifestyle recommendations discussed. Wellness handout given. Patient encouraged to exercise, monitor healthy diet, reduce stress when able, and take time for themselves.  Advised yearly pelvic exams, with Pap according ACOG guidelines per age, dental exams every 6 months, yearly eye exams, and dermatology exam as needed. Advise monthly self breast exams, yearly Mammogram if 40 or at risk, annual early flu immunization, colonoscopy at the age of 109, Zostavax at 50, and Pnuemovax at 77. Follow-up yearly for physical and labs unless concerns arise sooner and as recommended for chronic medical problems.  Risks, benefits, and alternatives of the medications and treatment plan prescribed today were discussed, and patient expressed understanding. Plan follow-up as discussed or as needed if any new symptoms or change in  chronic conditions.    Follow up in about 3 months (around 03/12/2024) for the [facility] Annual Wellness Visit. Subjective   Patient ID:  April Lane is a 83 y.o. (DOB July 23, 1940) female.     Patient presents with   Annual Exam    Patient presents for wellness visit today. The interval history is unremarkable. Patient history, meds, immunizations, and problem list have been reviewed and discussed. Patient given opportunity to voice concerns.  History of Present Illness The patient presents for a checkup.  She reports no memory issues and maintains a good appetite. Her weight has decreased by 2 pounds since her last visit, which she attributes to a conscious effort to reduce her food intake, particularly sweets between meals. Her diet typically includes hot chocolate in the morning, eggs with cheese, and a salad with black pepper. She avoids bread and salt. She has received both her influenza and COVID-19 vaccines at [facility]. She has not experienced any recent falls and uses a cane for mobility. She possesses a handicap sticker. She has completed her mammogram screenings, with the most recent one showing no signs of cancer or lumps. She reports no gastrointestinal issues or urinary difficulties. She has been diagnosed with cataracts but has declined surgical intervention as her vision remains satisfactory. She has a history of endometrial cancer, for which she underwent a hysterectomy at [facility]. She is currently cancer-free.  Diet: Her diet typically includes hot chocolate in the morning, eggs with cheese, and a salad with black pepper. She avoids bread and salt.  PAST SURGICAL HISTORY: - Hysterectomy for  endometrial cancer - Surgery for lung cancer  FAMILY HISTORY She reports no family history of breast cancer or prostate cancer in her father and brother. She had 6 brothers and 6 sisters. One of her brothers passed away due to poor circulation, which led to the removal of his  left leg.  Results   Allergies[1]  Current Outpatient Medications  Medication Instructions   acetaminophen  (TYLENOL ) 500 mg, Every 6 hours as needed   amLODIPine  besylate (NORVASC ) 5 mg, Oral, Daily   aspirin  81 mg, Daily   Bee Pollen 1000 MG TABS 1 tablet, 2 times a day   meclizine  HCl (ANTIVERT ) 25 mg, Oral, 2 times a day as needed   olmesartan medoxomil (BENICAR) 40 mg, Oral, Daily   simvastatin  (ZOCOR ) 20 mg, Oral, At bedtime   triamcinolone acetonide (KENALOG) 0.1% cream Topical, 2 times a day   Vitamin D (Cholecalciferol) 1,000 Units, Daily   Past Medical History:  Diagnosis Date   Allergy    Arthritis    Cystocele 05/21/2012   Without incontinence   Diverticulosis 12/10/2012   By pelvic CT 11/2012/[facility]   Hypertension    Multinodular goiter (nontoxic)    Osteopenia of the elderly 05/21/2012   Past Surgical History:  Procedure Laterality Date   Cholecystectomy  01/14/2013   Hysterectomy     uterine cancer   Tubal ligation     Social History[2]  Family History  Problem Relation Age of Onset   Hypertension Mother    Hypertension Father    Dementia Sister    Diabetes Brother    Heart disease Brother    Hearing loss Daughter    No Known Problems Daughter    Immunization History  Administered Date(s) Administered   Influenza Afluria [phone] 10/17/2011   Influenza Flulaval Quad 0.54ml 11/19/2015   Influenza High Dose 11/23/2012, 10/28/2013, 10/22/2014, 03/20/2019   Influenza Tri 02/13/2008, 11/27/2008   Influenza vaccine, quadrivalent, adjuvanted(Fluad) 01/03/2017, 09/30/2021   Influenza, high dose seasonal, preservative-free(Fluzone) 10/10/2022   Influenza, high-dose seasonal, quadrivalent, .7mL dose, preservative free(Fluzone) 03/20/2019, 11/12/2019, 11/21/2020   Influenza, live, trivalent, intranasal (Flumist) 01/04/2010   PPD Test 04/27/2009   Pfizer COVID-19 12+ years (Comirnaty) 11/22/2021, 10/21/2023   Pfizer COVID-19  Vaccine Monovalent 12+ [place] 08/25/2020   Pfizer-BioNTech COVID-19 Monovalent Vaccine mRNA 04/06/2019, 05/01/2019, 01/24/2020   Pneumococcal Conjugate 11/27/2008   Pneumococcal Conjugate 13 (Prevnar) 10/28/2013   Pneumococcal Polysaccharide (Pneumovax) 11/27/2008   Seasonal trivalent influenza vaccine, adjuvanted, preservative free 11/23/2010   Td (adult), 2 Lf tetanus toxoid, preservative free, adsorbed (TDVAX) 04/03/1995   Tdap 05/24/2013   Tetanus 02/13/2008   Health Maintenance  Topic Date Due   Zoster Vaccine (1 of 2) Never done   RSV Adult and Pregnancy (1 - 1-dose 75+ series) Never done   DTaP/Tdap/Td Vaccines (2 - Td or Tdap) 05/25/2023   Creatinine Level  09/12/2023   Potassium Level  09/12/2023   Lipid Panel  09/12/2023   Medicare Annual Wellness  03/14/2024   DEXA Scan  09/24/2024   Pneumococcal Vaccine: 50+ Years  Completed   Influenza Vaccine  Completed   Meningococcal B Vaccine  Aged Out   Hepatitis A Vaccine  Aged Out   Meningococcal Conjugate Vaccine  Aged Out   DEPRESSION SCREEN Patient Health Questionnaire    Falls Risk Screening  More data exists      03/15/2023  Falls Risk Assessment  Patient can ambulate Yes  Do you feel unsteady when standing or walking? No  Do you worry about  falling? No  Have you fallen in the past year? No   Review of Systems  Constitutional: Negative for chills, fever and weight loss.  HENT: Negative for congestion and ear pain.   Eyes: Negative for double vision and discharge.  Respiratory: Negative for cough and shortness of breath.   Cardiovascular: Negative for chest pain and leg swelling.  Breasts: no new or changing breast lumps, nipple changes or nipple discharge  Gastrointestinal: Negative for abdominal pain, blood in stool and stool changes.  Genitourinary: Negative for dysuria and hematuria.  Musculoskeletal: Negative for myalgias.  Skin: Negative for rash.  Neurological: Negative for speech  change and headaches.  Endo/Heme/Allergies: Does not bruise/bleed easily.  Psychiatric/Behavioral: Negative for depression and memory loss.  All other systems reviewed and are negative.  Objective  BP 106/62   Pulse 81   Temp 97.5 F (36.4 C) (Temporal)   Resp 16   Ht 5' 4 (1.626 m)   Wt 165 lb (74.8 kg)   SpO2 97%   Breastfeeding No   BMI 28.32 kg/m  Wt Readings from Last 3 Encounters:  12/11/23 165 lb (74.8 kg)  03/15/23 167 lb (75.8 kg)  09/12/22 170 lb (77.1 kg)    Physical Exam Eyes: Presence of cataract in both eyes. Respiratory: Clear to auscultation, no wheezing, rales or rhonchi.  General: The patient is a 83 y.o. female who appears to be in no acute distress. Psych: She is alert and oriented to person, place, and time. Her mood and affect are normal. HEENT: Normocephalic, atraumatic, non-icteric sclera, PERRL.  Tympanic membranes are without perforation or infection.  Nasopharynx is grossly normal.  Oropharynx is without mass or exudate. Neck:  Supple.  Trachea is in midline position.  The neck is without adenopathy, masses, or thyromegaly. Lungs:  Good breath sounds are noted bilaterally.  The lungs are clear to auscultation and percussion bilaterally.  The spine and CVA region are  nontender to palpation. Cardio:  Regular rate and rhythm without gallop, rub, or murmur.  Abdomen:  Bowel sounds are physiologic.  The abdomen is soft and nontender to palpation.  No masses are noted.  No hepatosplenomegaly is noted.  Skin:  No rashes or worrisome lesions are noted.  Extremities:  The extremities are without cyanosis or significant contusions.  Pitting edema is not noted.  ROM is normal in all four extremities. Pulses: Adequate pulses are noted in all four extremities and both carotid arteries. Neuro:  Mental status is normal.  CN 2-11 are grossly normal.  Motor and sensory exams are grossly normal. Gait is stable.    Alm FORBES Bilis, MD 12/11/2023, 1:18 PM    Medication list was reviewed, updated, and a copy provided to patient upon leaving.                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

## 2024-01-22 ENCOUNTER — Telehealth: Payer: Self-pay | Admitting: *Deleted

## 2024-01-22 ENCOUNTER — Inpatient Hospital Stay: Admitting: Psychiatry

## 2024-01-22 NOTE — Telephone Encounter (Signed)
 Patient was rescheduled due to late arrival to her 3pm appointment with Dr. Eldonna on 12/15 to 12/23 at 2:30 pm with Dr. Rogelio. Pt aware to arrive at 2:15 for check in. And agreed to appt. Date and time.

## 2024-01-22 NOTE — Progress Notes (Unsigned)
 This encounter was created in error - please disregard.

## 2024-01-24 ENCOUNTER — Telehealth: Payer: Self-pay

## 2024-01-24 NOTE — Telephone Encounter (Signed)
 Ms.Hunts daughter called to reschedule an upcoming appointment with Dr.Jackson-Moore on 12/23. She usually sees Dr.Newton and reports her mom is used to Dr.Newton and she wants to stay with [facility]. First available with Dr.Newton is 04/08/24 @ 4:00 (daughter requested late afternoon)  She will call periodically to see if can be moved up to an earlier spot.

## 2024-01-30 ENCOUNTER — Inpatient Hospital Stay: Admitting: Obstetrics & Gynecology

## 2024-02-05 ENCOUNTER — Inpatient Hospital Stay: Attending: Psychiatry | Admitting: Psychiatry

## 2024-02-05 ENCOUNTER — Encounter: Payer: Self-pay | Admitting: Psychiatry

## 2024-02-05 VITALS — BP 123/70 | HR 76 | Temp 98.5°F | Resp 19 | Wt 164.6 lb

## 2024-02-05 DIAGNOSIS — Z8542 Personal history of malignant neoplasm of other parts of uterus: Secondary | ICD-10-CM | POA: Diagnosis not present

## 2024-02-05 DIAGNOSIS — C541 Malignant neoplasm of endometrium: Secondary | ICD-10-CM

## 2024-02-05 DIAGNOSIS — Z08 Encounter for follow-up examination after completed treatment for malignant neoplasm: Secondary | ICD-10-CM | POA: Diagnosis not present

## 2024-02-05 NOTE — Patient Instructions (Signed)
It was a pleasure to see you in clinic today. - Normal exam today. - Return visit planned for 6 months.  Thank you very much for allowing me to provide care for you today.  I appreciate your confidence in choosing our Gynecologic Oncology team at [facility].  If you have any questions about your visit today please call our office or send [place] a MyChart message and we will get back to you as soon as possible.

## 2024-02-05 NOTE — Progress Notes (Signed)
 Gynecologic Oncology Return Clinic Visit  Date of Service: 02/05/2024 Referring Provider: Slater Door, MD   Assessment & Plan: April Lane is a 83 y.o. 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 She presents for surveillance.  Serous endometrial cancer: - NED on exam today. Bluish discoloration to the vaginal cuff that I suspect is due to thinning of tissue here, no lesions or nodularity. Unchanged. Will follow. - CT scan from 07/25/22 NED. -Previously discussed that in the setting of neoadjuvant therapy and high risk of recurrence, would consider ongoing surveillance with CT scan.  However, patient has expressed that she would decline any future treatment if of recurrence were to occur.  Given this, we discussed deferring imaging unless she were to have symptoms.  Patient is in agreement with this and comfortable with this plan.   - Imaging in the future would be done to guide discussion of prognosis and symptom management. - Surveillance and signs/symptoms of recurrence reviewed.  - CA125 was not a marker for pt. - Continue q63mo surveillance   RTC 6 months .  Hoy Masters, MD Gynecologic Oncology   Medical Decision Making I personally spent  TOTAL 15 minutes face-to-face and non-face-to-face in the care of this patient, which includes all pre, intra, and post visit time on the date of service.   Reason for Visit: Surveillance  Treatment History: Oncology History Overview Note  High grade serous, MSI stable   Endometrial cancer (HCC)  11/15/2021 Imaging   Pelvic ultrasound: Noted a thickened and heterogeneous endometrium measuring 2 cm.  Uterus was also noted to have multiple small fibroids with the total uterus measuring 9.5 cm.     11/15/2021 Initial Diagnosis   She presented with PMB   12/17/2021 Initial Biopsy   High-grade endometrial carcinoma, p53  mutated, FIGO grade 3 Comment: The immunohistochemistry and morphology are most consistent with high-grade serous carcinomaPatient presented to her provider for postmenopausal bleeding   12/17/2021 Initial Diagnosis   Endometrial cancer (HCC)   01/18/2022 Pathology Results   FINAL MICROSCOPIC DIAGNOSIS:  A.   SENTINAL LYMPH NODE, LEFT, OBTURATOR: -    Lymph node with Mullerian inclusions. -    No metastatic malignancy identified (0/1)  B.   SENTINAL LYMPH NODE, RIGHT, OBTURATOR: -    Lymph node with no malignancy identified (0/1).  C.   LYMPH NODE, LEFT, UTEROSACRAL, RESECTION: -    Calcification. -    Fibrous tissue with benign Mullerian inclusions. -    No lymph node tissue identified. -    No malignancy identified.  D.   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  -    Cervix:             Negative for dysplasia and malignancy. Myometrium:         Leiomyomas. -    Ovaries:       Senescent ovaries, with no malignancy identified.                     Left ovary with benign serous inclusions. -     Fallopian tubes:   No malignancy identified. 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   ONCOLOGY TABLE:  UTERUS, CARCINOMA OR CARCINOSARCOMA: Resection  Procedure: Total hysterectomy and bilateral salpingo-oophorectomy Histologic Type: Serous carcinoma Histologic Grade: High grade  Myometrial Invasion:      Depth of Myometrial Invasion (mm): 23 mm (slide D10)      Myometrial Thickness (mm): 26 mm (slide D10)      Percentage of Myometrial Invasion: 90% Uterine Serosa Involvement: Not identified Cervical stromal Involvement: Not identified Extent of involvement of other tissue/organs: Not identified Peritoneal/Ascitic Fluid: Not known Lymphovascular Invasion: No definite lymphovascular invasion identified Regional Lymph  Nodes: Regional lymph nodes present, all regional lymph nodes negative for tumor      Pelvic Lymph Nodes Examined: 2 Sentinel                                    0 Non-sentinel                                    2 Total      Pelvic Lymph Nodes with Metastasis: Not applicable      Para-aortic Lymph Nodes Examined:  Not applicable Distant Metastasis:  Not applicable      Distant Site(s) Involved: Not applicable Pathologic Stage Classification (pTNM, AJCC 8th Edition): pT1b, pN0(sn) Ancillary Studies: MMR / MSI testing will be ordered Representative Tumor Block: D10    01/18/2022 Cancer Staging   Staging form: Corpus Uteri - Carcinoma and Carcinosarcoma, AJCC 8th Edition - Clinical stage from 01/18/2022: FIGO Stage IB (cT1b, cN0(sn), cM0) - Signed by Eldonna Mays, MD on 02/11/2022 Histopathologic type: Serous cystadenocarcinoma, NOS Stage prefix: Initial diagnosis Method of lymph node assessment: Sentinel lymph node biopsy Histologic grade (G): G3 Histologic grading system: 3 grade system Lymph-vascular invasion (LVI): LVI not present (absent)/not identified   01/20/2022 Procedure   reoperative Diagnosis: Grade 3 endometrial cancer, p53 mutated, like serous   Postoperative Diagnosis: Same   Procedures: Robotic-assisted total laparoscopic hysterectomy, bilateral salpingo-oophorectomy, bilateral sentinel lymph node evaluation and biopsy   Surgeon: Mays Eldonna, MD Findings: Normal upper abdominal survey with normal liver surface and diaphragm. Normal appearing small and large bowel. Ascending colon at the hepatic flexure adherent to the lateral abdominal wall.  Appendix visualized and adherent to the right lower quadrant.  Filmy adhesions of the rectosigmoid colon to the left lower abdominal wall, left pelvic sidewall, and cul-de-sac. Extensive diverticulosis present. Few vesicular lesions on the peritoneum of the posterior cul-de-sac that were removed en bloc with the uterine  specimen, possible endosalpingosis.  Subcentimeter fibrotic appearing lesion on the left uterosacral ligament, excised.  Globular uterus. Bilateral fallopian tubes adherent to the ovarian fossas with left hydrosalpinx. Normal bilateral ovaries. Sentinel mapping on right to the obturator space; sentinel mapping on left to the obturator space.       02/25/2022 - 02/25/2022 Chemotherapy   Patient is on Treatment Plan : UTERINE Carboplatin AUC 6 + Paclitaxel q21d       Interval History: Patient reports that she is doing well today. She denies any new vaginal bleeding, abdominal/pelvic pain, unintentional weight loss, change in bowel or bladder habits, early satiety, bloating, nausea/vomiting. Walks daily when the weather permits.    Past Medical/Surgical History: Past Medical History:  Diagnosis Date   Arthritis    Cataract    left eye immature   Diverticulitis    Endometrial cancer (HCC)    Fatigue    GI bleed    many yrs ago   History of blood transfusion    no abnormal reactions  HTN (hypertension)    takes Amlodipine  and Losartan  daily   Nausea with vomiting 12/01/2012   Osteoporosis    steroid injection right knee a few yrs ago   Peptic ulcer    Vitamin D deficiency     Past Surgical History:  Procedure Laterality Date   CHOLECYSTECTOMY N/A 01/14/2013   Procedure: LAPAROSCOPIC CHOLECYSTECTOMY WITH INTRAOPERATIVE CHOLANGIOGRAM;  Surgeon: Deward GORMAN Curvin DOUGLAS, MD;  Location: [facility] OR;  Service: General;  Laterality: N/A;   LYMPH NODE DISSECTION N/A 01/18/2022   Procedure: LYMPH NODE DISSECTION;  Surgeon: Eldonna Mays, MD;  Location: [facility];  Service: Gynecology;  Laterality: N/A;   ROBOTIC ASSISTED TOTAL HYSTERECTOMY WITH BILATERAL SALPINGO OOPHERECTOMY Bilateral 01/18/2022   Procedure: XI ROBOTIC ASSISTED TOTAL HYSTERECTOMY WITH BILATERAL SALPINGO OOPHORECTOMY,POSSIBLE LAPARATOMY;  Surgeon: Eldonna Mays, MD;  Location: [facility];  Service: Gynecology;  Laterality: Bilateral;    SENTINEL NODE BIOPSY N/A 01/18/2022   Procedure: SENTINEL NODE BIOPSY;  Surgeon: Eldonna Mays, MD;  Location: [facility];  Service: Gynecology;  Laterality: N/A;   TUBAL LIGATION     at age 76    Family History  Problem Relation Age of Onset   Other Mother 67       Died of old age   Heart attack Father 20       Died of natural causes   Prostate cancer Brother    Breast cancer Neg Hx    Ovarian cancer Neg Hx    Uterine cancer Neg Hx    Colon cancer Neg Hx     Social History   Socioeconomic History   Marital status: Single    Spouse name: Not on file   Number of children: Not on file   Years of education: Not on file   Highest education level: Not on file  Occupational History   Occupation: CNA    Employer: OTHER  Tobacco Use   Smoking status: Never   Smokeless tobacco: Never  Vaping Use   Vaping status: Never Used  Substance and Sexual Activity   Alcohol use: No   Drug use: No   Sexual activity: Not on file  Other Topics Concern   Not on file  Social History Narrative   Not on file   Social Drivers of Health   Tobacco Use: Low Risk (12/11/2023)   Received from [facility]   Patient History    Smoking Tobacco Use: Never    Smokeless Tobacco Use: Never    Passive Exposure: Never  Financial Resource Strain: Low Risk (03/15/2023)   Received from [facility]   Overall Financial Resource Strain (CARDIA)    Difficulty of Paying Living Expenses: Not hard at all  Food Insecurity: No Food Insecurity (03/15/2023)   Received from [facility]   Epic    Within the past 12 months, you worried that your food would run out before you got the money to buy more.: Never true    Within the past 12 months, the food you bought just didn't last and you didn't have money to get more.: Never true  Transportation Needs: No Transportation Needs (03/15/2023)   Received from [facility] - Transportation    Lack of Transportation (Medical): No    Lack of Transportation  (Non-Medical): No  Physical Activity: Inactive (03/15/2023)   Received from [facility]   Exercise Vital Sign    On average, how many days per week do you engage in moderate to strenuous exercise (like a brisk walk)?:  0 days    On average, how many minutes do you engage in exercise at this level?: 30 min  Stress: No Stress Concern Present (03/15/2023)   Received from [facility] - Occupational Stress Questionnaire    Feeling of Stress : Not at all  Social Connections: Socially Integrated (03/15/2023)   Received from [facility]   Social Network    How would you rate your social network (family, work, friends)?: Good participation with social networks  Depression (PHQ2-9): Not on file  Alcohol Screen: Not on file  Housing: Low Risk (03/15/2023)   Received from [facility]    In the last 12 months, was there a time when you were not able to pay the mortgage or rent on time?: No    In the past 12 months, how many times have you moved where you were living?: 1    At any time in the past 12 months, were you homeless or living in a shelter (including now)?: No  Utilities: Not At Risk (03/15/2023)   Received from [facility] Utilities    Threatened with loss of utilities: No  Health Literacy: Not on file    Current Medications:  Current Outpatient Medications:    acetaminophen  (TYLENOL ) 500 MG tablet, Take 500 mg by mouth every 6 (six) hours as needed for moderate pain., Disp: , Rfl:    amLODipine  (NORVASC ) 5 MG tablet, Take 5 mg by mouth daily., Disp: , Rfl:    Bee Pollen 1000 MG TABS, Take 2,000 mg by mouth daily., Disp: , Rfl:    Cholecalciferol (VITAMIN D) 50 MCG (2000 UT) tablet, Take 2,000 Units by mouth daily., Disp: , Rfl:    meclizine  (ANTIVERT ) 25 MG tablet, Take 25 mg by mouth 2 (two) times daily as needed (vertigo)., Disp: , Rfl: 1   olmesartan (BENICAR) 40 MG tablet, Take 40 mg by mouth daily., Disp: , Rfl:     simvastatin  (ZOCOR ) 20 MG tablet, Take 20 mg by mouth at bedtime., Disp: , Rfl: 3   triamcinolone cream (KENALOG) 0.1 %, Apply topically 2 (two) times daily., Disp: , Rfl:   Review of Symptoms: Complete 10-system review is positive for: none  Physical Exam: BP 123/70 (BP Location: Right Arm, Patient Position: Sitting)   Pulse 76   Temp 98.5 F (36.9 C) (Oral)   Resp 19   Wt 164 lb 9.6 oz (74.7 kg)   SpO2 99%   BMI 28.25 kg/m  General: Alert, oriented, no acute distress. HEENT: Normocephalic, atraumatic. Neck symmetric without masses. Sclera anicteric.  Chest: Normal work of breathing. Clear to auscultation bilaterally.   Cardiovascular: Regular rate and rhythm, no murmurs. Abdomen: Soft, nontender.  Normoactive bowel sounds.  No masses or hepatosplenomegaly appreciated.  Well-healed incision. Extremities: Grossly normal range of motion.  Warm, well perfused.  No edema bilaterally. Skin: No rashes or lesions noted. Lymphatics: No cervical, supraclavicular, or inguinal adenopathy. GU: Normal appearing external genitalia without erythema, excoriation, or lesions.  Speculum exam reveals prolapse (green speculum used). Cuff smooth but with bluish discoloration at apex. No raised lesions. Bimanual exam reveals smooth vaginal cuff. No nodularity or pelvic mass. Exam chaperoned by Kimberly Jordan, CMA     Laboratory & Radiologic Studies: None

## 2024-04-08 ENCOUNTER — Inpatient Hospital Stay: Admitting: Psychiatry

## 2024-08-05 ENCOUNTER — Inpatient Hospital Stay: Attending: Psychiatry | Admitting: Psychiatry
